# Patient Record
Sex: Male | Born: 1985 | Race: White | Hispanic: No | Marital: Single | Smoking: Never smoker
Health system: Southern US, Community
[De-identification: ages and names within clinical notes are randomized; demographics above are authoritative.]

## PROBLEM LIST (undated history)

## (undated) DIAGNOSIS — R519 Headache, unspecified: Secondary | ICD-10-CM

## (undated) DIAGNOSIS — R51 Headache: Secondary | ICD-10-CM

## (undated) HISTORY — PX: COSMETIC SURGERY: SHX468

---

## 2009-09-13 ENCOUNTER — Emergency Department (HOSPITAL_BASED_OUTPATIENT_CLINIC_OR_DEPARTMENT_OTHER): Admission: EM | Admit: 2009-09-13 | Discharge: 2009-09-13 | Payer: Self-pay | Admitting: Emergency Medicine

## 2010-12-23 ENCOUNTER — Emergency Department (HOSPITAL_COMMUNITY): Payer: Self-pay

## 2010-12-23 ENCOUNTER — Emergency Department (HOSPITAL_COMMUNITY)
Admission: EM | Admit: 2010-12-23 | Discharge: 2010-12-23 | Discharge status: Against Medical Advice | Payer: Self-pay | Attending: Emergency Medicine | Admitting: Emergency Medicine

## 2010-12-23 ENCOUNTER — Inpatient Hospital Stay (HOSPITAL_COMMUNITY): Admission: RE | Admit: 2010-12-23 | Payer: Self-pay | Source: Ambulatory Visit

## 2010-12-23 DIAGNOSIS — Z0389 Encounter for observation for other suspected diseases and conditions ruled out: Secondary | ICD-10-CM | POA: Insufficient documentation

## 2010-12-24 ENCOUNTER — Emergency Department (HOSPITAL_BASED_OUTPATIENT_CLINIC_OR_DEPARTMENT_OTHER)
Admission: EM | Admit: 2010-12-24 | Discharge: 2010-12-24 | Disposition: A | Discharge status: Home / Self Care | Payer: Self-pay | Attending: Emergency Medicine | Admitting: Emergency Medicine

## 2010-12-24 ENCOUNTER — Emergency Department (INDEPENDENT_AMBULATORY_CARE_PROVIDER_SITE_OTHER): Payer: Self-pay

## 2010-12-24 DIAGNOSIS — S52509A Unspecified fracture of the lower end of unspecified radius, initial encounter for closed fracture: Secondary | ICD-10-CM | POA: Insufficient documentation

## 2010-12-24 DIAGNOSIS — W19XXXA Unspecified fall, initial encounter: Secondary | ICD-10-CM | POA: Insufficient documentation

## 2011-10-26 ENCOUNTER — Emergency Department (INDEPENDENT_AMBULATORY_CARE_PROVIDER_SITE_OTHER): Payer: Self-pay

## 2011-10-26 ENCOUNTER — Encounter (HOSPITAL_BASED_OUTPATIENT_CLINIC_OR_DEPARTMENT_OTHER): Payer: Self-pay | Admitting: Emergency Medicine

## 2011-10-26 ENCOUNTER — Emergency Department (HOSPITAL_BASED_OUTPATIENT_CLINIC_OR_DEPARTMENT_OTHER)
Admission: EM | Admit: 2011-10-26 | Discharge: 2011-10-26 | Disposition: A | Discharge status: Home / Self Care | Payer: Self-pay | Attending: Emergency Medicine | Admitting: Emergency Medicine

## 2011-10-26 DIAGNOSIS — IMO0002 Reserved for concepts with insufficient information to code with codable children: Secondary | ICD-10-CM | POA: Insufficient documentation

## 2011-10-26 DIAGNOSIS — S62329A Displaced fracture of shaft of unspecified metacarpal bone, initial encounter for closed fracture: Secondary | ICD-10-CM

## 2011-10-26 DIAGNOSIS — S62319A Displaced fracture of base of unspecified metacarpal bone, initial encounter for closed fracture: Secondary | ICD-10-CM | POA: Insufficient documentation

## 2011-10-26 DIAGNOSIS — S62308A Unspecified fracture of other metacarpal bone, initial encounter for closed fracture: Secondary | ICD-10-CM

## 2011-10-26 DIAGNOSIS — X58XXXA Exposure to other specified factors, initial encounter: Secondary | ICD-10-CM

## 2011-10-26 MED ORDER — HYDROCODONE-ACETAMINOPHEN 5-500 MG PO TABS: 1.0000 | ORAL_TABLET | Freq: Four times a day (QID) | ORAL | Status: AC | PRN

## 2011-10-26 NOTE — ED Provider Notes (Signed)
Medical screening examination/treatment/procedure(s) were performed by non-physician practitioner and as supervising physician I was immediately available for consultation/collaboration.   Namya Voges M Nykolas Bacallao, MD 10/26/11 2320 
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
## 2011-12-16 ENCOUNTER — Emergency Department (HOSPITAL_BASED_OUTPATIENT_CLINIC_OR_DEPARTMENT_OTHER)
Admission: EM | Admit: 2011-12-16 | Discharge: 2011-12-16 | Disposition: A | Discharge status: Home / Self Care | Payer: Self-pay | Attending: Emergency Medicine | Admitting: Emergency Medicine

## 2011-12-16 ENCOUNTER — Encounter (HOSPITAL_BASED_OUTPATIENT_CLINIC_OR_DEPARTMENT_OTHER): Payer: Self-pay | Admitting: *Deleted

## 2011-12-16 DIAGNOSIS — Z880 Allergy status to penicillin: Secondary | ICD-10-CM | POA: Insufficient documentation

## 2011-12-16 DIAGNOSIS — B009 Herpesviral infection, unspecified: Secondary | ICD-10-CM | POA: Insufficient documentation

## 2011-12-16 MED ORDER — DOCOSANOL 10 % EX CREA: TOPICAL_CREAM | CUTANEOUS | Status: DC

## 2011-12-16 NOTE — Discharge Instructions (Signed)
Cold Sores (Herpes Simplex Virus) The herpes simplex virus causes fever blisters or cold sores. Fever blisters are small sores on the lips, gums or roof of the mouth. People commonly get infected with this herpes virus but do not have any symptoms. The blisters may break out when a person:  Is especially tired.   Has a fever.   Is menstruating (in women - having a monthly period).   Under emotional stress.   Has another infection (such as a cold).   Is exposed to sunlight.  Pain, tingling, itch, burning may occur before the blisters in the affected area. The blisters usually heal within a week. The virus can be easily passed to other people and to other parts of the body such as the eyes and sex organs. HOME CARE INSTRUCTIONS    Only take over-the-counter or prescription medicines for pain, discomfort, or fever as directed by your caregiver. Do not use aspirin.   Do not touch the blisters or pick the scabs. Wash your hands often and do not touch your eyes without washing your hands first.   To help reduce discomfort, apply an ice cube or ice pack to your lip for 30 minutes or suck on frozen juice bars.   Use a sun screen on your lips when you go outdoors.   This infection is contagious. Avoid close contact with other people, especially kissing or oral sex, until blisters heal. The virus that causes cold sores usually is different than the one that causes sores on the genitals. However, cold sores may occur in persons who have oral sex with a partner who has genital herpes.   Hot, cold, or salty foods may hurt your mouth. Use a straw to drink. Eating a well-balanced diet will help healing.  SEEK IMMEDIATE MEDICAL CARE IF:    Your eye feels irritated, painful, or you feel like you have something in your eye.   You develop a fever, feel achy, or see pus instead of clear fluid in the sores. These are signs of a bacterial (germ) infection. You can apply over-the-counter neomycin ointment.     You get blisters on your genitals.   You develop new unexplained symptoms.  MAKE SURE YOU:    Understand these instructions.   Will watch your condition.   Will get help right away if you are not doing well or get worse.  Document Released: 08/28/2000 Document Revised: 08/20/2011 Document Reviewed: 02/28/2009 [employer] Patient Information 2012 [employer]. 

## 2011-12-16 NOTE — ED Provider Notes (Signed)
History     CSN: [id number]  Arrival date & time 12/16/11  2310   First MD Initiated Contact with Patient 12/16/11 2323      Chief Complaint  Patient presents with  . Dental Problem     Patient is a 26 y.o. male presenting with rash. The history is provided by the patient.  Rash  This is a new problem. The problem has been gradually worsening. The problem is associated with nothing. Affected Location: lips. The pain is mild. The pain has been constant since onset. Associated symptoms include blisters and pain.  pt reports "cold sore" to lower lip for past several days.  He also reports he feels his "gums are swollen" No fever today No vomting No other complaints  PMH - none  History reviewed. No pertinent past surgical history.  History reviewed. No pertinent family history.  History  Substance Use Topics  . Smoking status: Never Smoker   . Smokeless tobacco: Not on file  . Alcohol Use: No      Review of Systems  Constitutional: Negative for fever.  Skin: Positive for rash.    Allergies  Penicillins  Home Medications   Current Outpatient Rx  Name Route Sig Dispense Refill  . DOCOSANOL 10 % EX CREA  Apply to cold sores 5x/day up to 10 days 1 Tube 0  . IBUPROFEN 200 MG PO TABS Oral Take 800 mg by mouth once.      BP 129/94  Pulse 58  Temp(Src) 98 F (36.7 C) (Oral)  Resp 18  Ht 5\' 9"  (1.753 m)  Wt 165 lb (74.844 kg)  BMI 24.37 kg/m2  SpO2 100%  Physical Exam CONSTITUTIONAL: Well developed/well nourished HEAD AND FACE: Normocephalic/atraumatic EYES: EOMI/PERRL ENMT: Mucous membranes moist.  Poor dentition.  No abscess.  No trismus.  No dental pain noted He has scattered vesicles to lower lip but no secondary signs of infection/drainage. Uvula midline.  No pharyngeal exudates NECK: supple no meningeal signs CV: S1/S2 noted, no murmurs/rubs/gallops noted LUNGS: Lungs are clear to auscultation bilaterally, no apparent distress ABDOMEN: soft,  nontender, no rebound or guarding NEURO: Pt is awake/alert, moves all extremitiesx4 EXTREMITIES: pulses normal, full ROM SKIN: warm, color normal PSYCH: no abnormalities of mood noted  ED Course  Procedures (including critical care time)    1. Herpes simplex       MDM  Nursing notes reviewed and considered in documentation  Pt reports he "took a pill" from girlfriend for his pain on his lips.  I advised him against this        Joya Gaskins, MD 12/16/11 [phone]

## 2011-12-16 NOTE — ED Notes (Signed)
Pt c/o cold sore to lip, gum swelling and pain to face for few days, denies fever

## 2011-12-16 NOTE — ED Notes (Signed)
MD at bedside. 

## 2012-06-11 IMAGING — CR DG HAND COMPLETE 3+V*R*
3 series · 3 of 3 positions shown · non-contrast
Comparison: 12/24/2010

CLINICAL DATA: Blunt trauma to the right hand last night.  Swelling
and pain.

RIGHT HAND - COMPLETE 3+ VIEW

[x hand pa right]
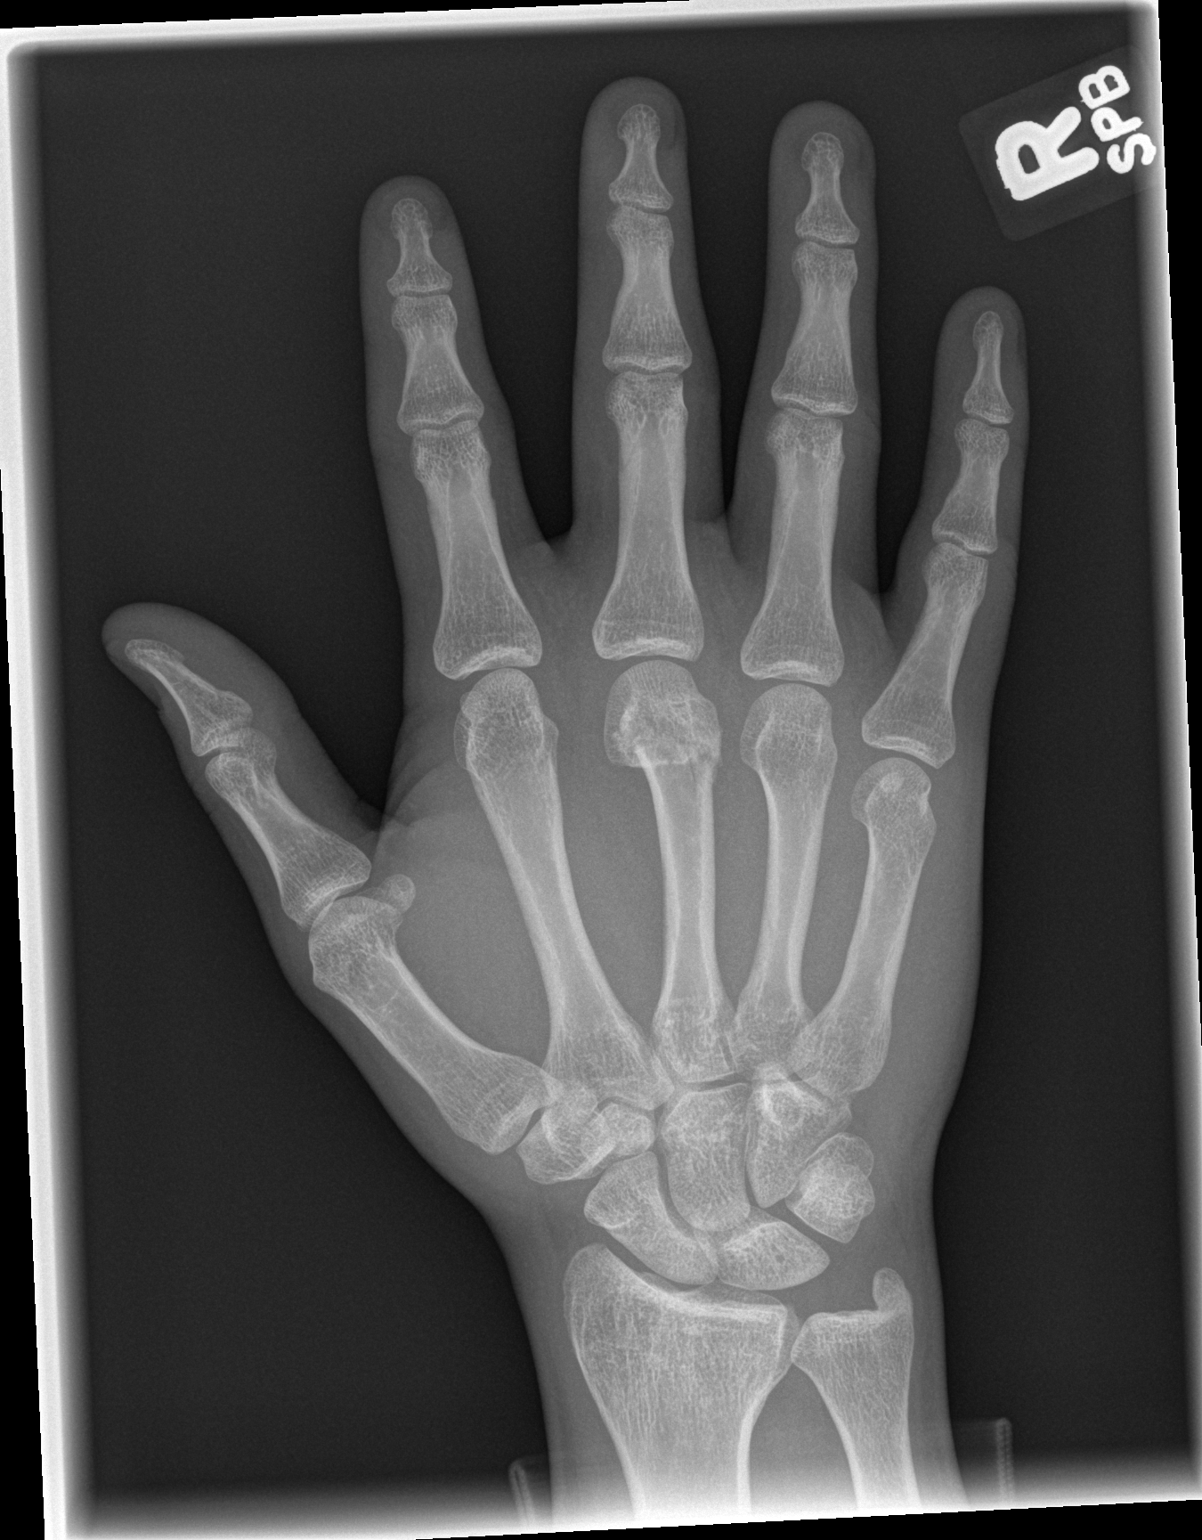

[x hand oblique right]
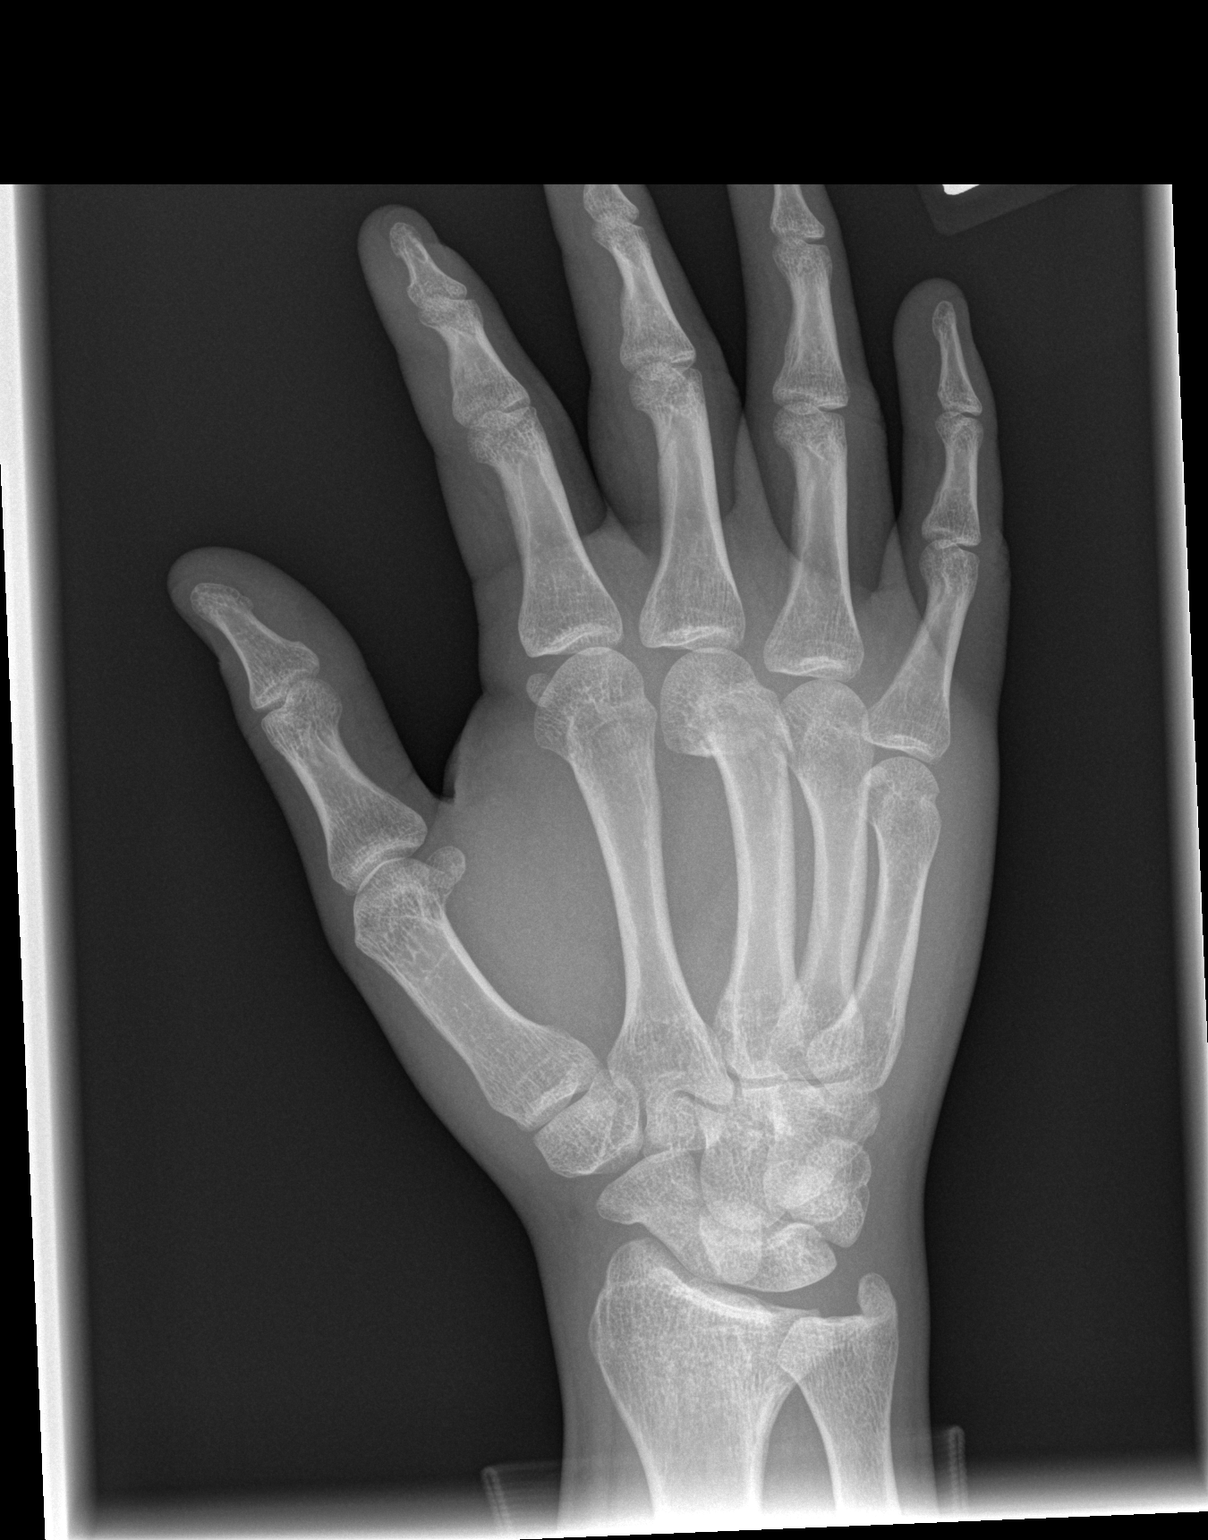

[x hand lat right]
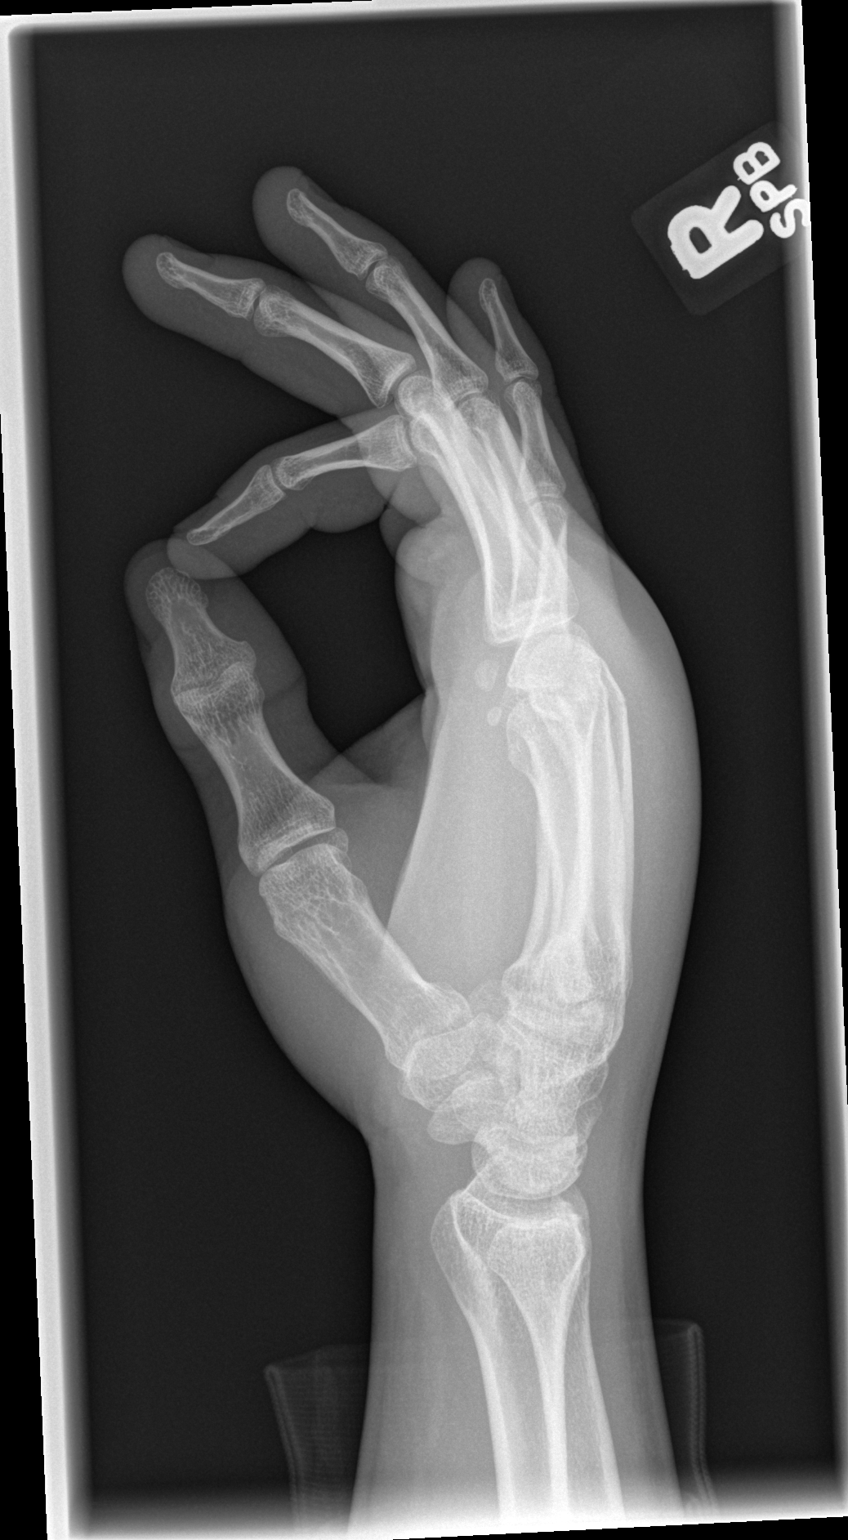

[3 of 3 positions shown; findings below may reference images not displayed]

FINDINGS: Comminuted appearing transverse fracture of the distal
right third metacarpal shaft with volar angulation of the distal
fracture fragments.  Dorsal soft tissue swelling.  No radiopaque
foreign bodies.  Deformity of the ulnar styloid process consistent
with healed fracture.  Right hand appears otherwise intact.
IMPRESSION: Acute fracture of the distal right third metacarpal shaft with
volar angulation of the distal fracture fragment.

## 2012-07-10 ENCOUNTER — Emergency Department (HOSPITAL_COMMUNITY)
Admission: EM | Admit: 2012-07-10 | Discharge: 2012-07-10 | Disposition: A | Discharge status: Home / Self Care | Payer: Worker's Compensation | Attending: Emergency Medicine | Admitting: Emergency Medicine

## 2012-07-10 ENCOUNTER — Encounter (HOSPITAL_COMMUNITY): Payer: Self-pay

## 2012-07-10 DIAGNOSIS — W35XXXA Explosion and rupture of boiler, initial encounter: Secondary | ICD-10-CM | POA: Insufficient documentation

## 2012-07-10 DIAGNOSIS — Y93E9 Activity, other interior property and clothing maintenance: Secondary | ICD-10-CM | POA: Insufficient documentation

## 2012-07-10 DIAGNOSIS — Y929 Unspecified place or not applicable: Secondary | ICD-10-CM | POA: Insufficient documentation

## 2012-07-10 DIAGNOSIS — S61209A Unspecified open wound of unspecified finger without damage to nail, initial encounter: Secondary | ICD-10-CM | POA: Insufficient documentation

## 2012-07-10 DIAGNOSIS — S61219A Laceration without foreign body of unspecified finger without damage to nail, initial encounter: Secondary | ICD-10-CM

## 2012-07-10 MED ORDER — TETANUS-DIPHTH-ACELL PERTUSSIS 5-2.5-18.5 LF-MCG/0.5 IM SUSP
0.5000 mL | Freq: Once | INTRAMUSCULAR | Status: AC
  Administered 2012-07-10: 0.5 mL via INTRAMUSCULAR
  Filled 2012-07-10: qty 0.5

## 2012-07-10 NOTE — ED Notes (Signed)
Patient reports that he got his finger cut by a fruit slicer at work around [employer]. Patient has a small laceration at the tip of his left middle finger. Bleeding controlled.

## 2012-07-10 NOTE — ED Provider Notes (Signed)
History     CSN: [id number]  Arrival date & time 07/10/12  [place]   First MD Initiated Contact with Patient 07/10/12 1908      Chief Complaint  Patient presents with  . Laceration    (Consider location/radiation/quality/duration/timing/severity/associated sxs/prior treatment) HPI Comments: This is a 26 year old male, who presents to the emergency department with a laceration on his left middle finger. The patient reports that he was doing dishes tonight when he cut his finger. Bleeding is controlled. Patient is in mild pain. No radiating symptoms.  The history is provided by the patient. No language interpreter was used.    History reviewed. No pertinent past medical history.  Past Surgical History  Procedure Date  . Cosmetic surgery     bit by a dog on the face    History reviewed. No pertinent family history.  History  Substance Use Topics  . Smoking status: Never Smoker   . Smokeless tobacco: Never Used  . Alcohol Use: No      Review of Systems  Skin: Positive for wound.       Left middle finger laceration  All other systems reviewed and are negative.    Allergies  Penicillins  Home Medications   Current Outpatient Rx  Name Route Sig Dispense Refill  . DOCOSANOL 10 % EX CREA  Apply to cold sores 5x/day up to 10 days 1 Tube 0  . IBUPROFEN 200 MG PO TABS Oral Take 800 mg by mouth once.      BP 128/78  Temp 97.8 F (36.6 C) (Oral)  Resp 16  SpO2 99%  Physical Exam  Nursing note and vitals reviewed. Constitutional: He is oriented to person, place, and time. He appears well-developed and well-nourished.  HENT:  Head: Normocephalic and atraumatic.  Eyes: Conjunctivae normal and EOM are normal. Pupils are equal, round, and reactive to light.  Neck: Normal range of motion. Neck supple.  Cardiovascular: Normal rate, regular rhythm and normal heart sounds.   Pulmonary/Chest: Effort normal. No respiratory distress.  Abdominal: Soft. Bowel sounds are  normal.  Musculoskeletal: Normal range of motion.  Neurological: He is alert and oriented to person, place, and time.  Skin: Skin is warm and dry.       1 cm very shallow laceration to the left middle finger, no foreign body,  Psychiatric: He has a normal mood and affect. His behavior is normal. Judgment and thought content normal.    ED Course  Procedures (including critical care time)  Labs Reviewed - No data to display No results found. Laceration:  1 cm laceration of middle left finger repaired using Dermabond, adequate irrigation and proper cleansing was used.  1. Finger laceration       MDM  26 year old male with left middle finger laceration. Laceration was irrigated and cleansed appropriately and then repaired using Dermabond. Return precautions have been given. The patient is stable and ready for discharge. Tetanus updated.        Roxy Horseman, PA-C 07/10/12 1937  Roxy Horseman, PA-C 07/10/12 1939

## 2012-07-10 NOTE — ED Provider Notes (Signed)
Medical screening examination/treatment/procedure(s) were performed by non-physician practitioner and as supervising physician I was immediately available for consultation/collaboration.  Toy Baker, MD 07/10/12 2252

## 2014-03-17 ENCOUNTER — Ambulatory Visit: Payer: Self-pay

## 2014-03-18 ENCOUNTER — Emergency Department (HOSPITAL_BASED_OUTPATIENT_CLINIC_OR_DEPARTMENT_OTHER)
Admission: EM | Admit: 2014-03-18 | Discharge: 2014-03-18 | Disposition: A | Discharge status: Home / Self Care | Payer: Self-pay | Attending: Emergency Medicine | Admitting: Emergency Medicine

## 2014-03-18 ENCOUNTER — Encounter (HOSPITAL_BASED_OUTPATIENT_CLINIC_OR_DEPARTMENT_OTHER): Payer: Self-pay | Admitting: Emergency Medicine

## 2014-03-18 DIAGNOSIS — Z791 Long term (current) use of non-steroidal anti-inflammatories (NSAID): Secondary | ICD-10-CM | POA: Insufficient documentation

## 2014-03-18 DIAGNOSIS — R110 Nausea: Secondary | ICD-10-CM | POA: Insufficient documentation

## 2014-03-18 DIAGNOSIS — Z880 Allergy status to penicillin: Secondary | ICD-10-CM | POA: Insufficient documentation

## 2014-03-18 DIAGNOSIS — G4489 Other headache syndrome: Secondary | ICD-10-CM | POA: Insufficient documentation

## 2014-03-18 MED ORDER — SUMATRIPTAN SUCCINATE 100 MG PO TABS: 100.0000 mg | ORAL_TABLET | ORAL | Status: DC | PRN
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
## 2014-03-18 NOTE — Discharge Instructions (Signed)

## 2014-03-18 NOTE — ED Notes (Signed)
Pt reports that he is getting more headaches recently. Has been taking ibuprofen with no relief. Pt sts he doesn't have a PMD. Sts that he has some nausea sometimes.  Reports having a mild headache at this time.

## 2014-03-19 NOTE — ED Provider Notes (Signed)
Medical screening examination/treatment/procedure(s) were performed by non-physician practitioner and as supervising physician I was immediately available for consultation/collaboration.  Shanna CiscoMegan E Erica Richwine, MD 03/19/14 2029

## 2014-04-29 ENCOUNTER — Emergency Department (HOSPITAL_BASED_OUTPATIENT_CLINIC_OR_DEPARTMENT_OTHER)
Admission: EM | Admit: 2014-04-29 | Discharge: 2014-04-30 | Disposition: A | Discharge status: Home / Self Care | Payer: Self-pay | Attending: Emergency Medicine | Admitting: Emergency Medicine

## 2014-04-29 ENCOUNTER — Encounter (HOSPITAL_BASED_OUTPATIENT_CLINIC_OR_DEPARTMENT_OTHER): Payer: Self-pay | Admitting: Emergency Medicine

## 2014-04-29 DIAGNOSIS — N509 Disorder of male genital organs, unspecified: Secondary | ICD-10-CM | POA: Insufficient documentation

## 2014-04-29 DIAGNOSIS — Z791 Long term (current) use of non-steroidal anti-inflammatories (NSAID): Secondary | ICD-10-CM | POA: Insufficient documentation

## 2014-04-29 DIAGNOSIS — R102 Pelvic and perineal pain: Secondary | ICD-10-CM

## 2014-04-29 DIAGNOSIS — R21 Rash and other nonspecific skin eruption: Secondary | ICD-10-CM | POA: Insufficient documentation

## 2014-04-29 DIAGNOSIS — Z79899 Other long term (current) drug therapy: Secondary | ICD-10-CM | POA: Insufficient documentation

## 2014-04-29 DIAGNOSIS — Z880 Allergy status to penicillin: Secondary | ICD-10-CM | POA: Insufficient documentation

## 2014-04-29 NOTE — ED Notes (Signed)
C/o rash to to buttocks. States rash is on and off for past couple months. States rash is irritated  by the type of work that he does. Denies any other location of rash. States that he has never looked at the rash. Denies any fever.

## 2014-04-30 ENCOUNTER — Encounter (HOSPITAL_BASED_OUTPATIENT_CLINIC_OR_DEPARTMENT_OTHER): Payer: Self-pay | Admitting: Emergency Medicine
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
## 2014-04-30 NOTE — ED Notes (Signed)
Pt ambulating independently w/ steady gait on d/c in no acute distress, A&Ox4.  

## 2014-07-15 ENCOUNTER — Encounter (HOSPITAL_BASED_OUTPATIENT_CLINIC_OR_DEPARTMENT_OTHER): Payer: Self-pay

## 2014-07-15 ENCOUNTER — Emergency Department (HOSPITAL_BASED_OUTPATIENT_CLINIC_OR_DEPARTMENT_OTHER)
Admission: EM | Admit: 2014-07-15 | Discharge: 2014-07-15 | Disposition: A | Discharge status: Home / Self Care | Payer: Self-pay | Attending: Emergency Medicine | Admitting: Emergency Medicine

## 2014-07-15 DIAGNOSIS — Z880 Allergy status to penicillin: Secondary | ICD-10-CM | POA: Insufficient documentation

## 2014-07-15 DIAGNOSIS — H00011 Hordeolum externum right upper eyelid: Secondary | ICD-10-CM | POA: Insufficient documentation

## 2014-07-15 DIAGNOSIS — H00013 Hordeolum externum right eye, unspecified eyelid: Secondary | ICD-10-CM

## 2014-07-15 MED ORDER — TETRACAINE HCL 0.5 % OP SOLN
1.0000 [drp] | Freq: Once | OPHTHALMIC | Status: DC
  Filled 2014-07-15: qty 2

## 2014-07-15 MED ORDER — ERYTHROMYCIN 5 MG/GM OP OINT
TOPICAL_OINTMENT | Freq: Four times a day (QID) | OPHTHALMIC | Status: DC
  Administered 2014-07-15: 23:00:00 via OPHTHALMIC
  Filled 2014-07-15: qty 3.5

## 2014-07-15 MED ORDER — FLUORESCEIN SODIUM 1 MG OP STRP
1.0000 | ORAL_STRIP | Freq: Once | OPHTHALMIC | Status: AC
  Administered 2014-07-15: 1 via OPHTHALMIC
  Filled 2014-07-15: qty 1

## 2014-07-15 NOTE — ED Notes (Signed)
Pt reports his eye was itching and when he scratched it his eye became red and swollen.  Denies vision changes.

## 2014-07-15 NOTE — Discharge Instructions (Signed)
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
## 2014-09-09 ENCOUNTER — Emergency Department (HOSPITAL_BASED_OUTPATIENT_CLINIC_OR_DEPARTMENT_OTHER)
Admission: EM | Admit: 2014-09-09 | Discharge: 2014-09-09 | Disposition: A | Discharge status: Home / Self Care | Payer: Self-pay | Attending: Emergency Medicine | Admitting: Emergency Medicine

## 2014-09-09 ENCOUNTER — Encounter (HOSPITAL_BASED_OUTPATIENT_CLINIC_OR_DEPARTMENT_OTHER): Payer: Self-pay | Admitting: Emergency Medicine

## 2014-09-09 DIAGNOSIS — H04302 Unspecified dacryocystitis of left lacrimal passage: Secondary | ICD-10-CM | POA: Insufficient documentation

## 2014-09-09 DIAGNOSIS — Z880 Allergy status to penicillin: Secondary | ICD-10-CM | POA: Insufficient documentation

## 2014-09-09 HISTORY — DX: Headache, unspecified: R51.9

## 2014-09-09 HISTORY — DX: Headache: R51

## 2014-09-09 MED ORDER — CLINDAMYCIN HCL 300 MG PO CAPS: 300.0000 mg | ORAL_CAPSULE | Freq: Four times a day (QID) | ORAL | Status: DC

## 2014-09-09 NOTE — ED Provider Notes (Signed)
CSN: [id number]     Arrival date & time 09/09/14  2107 History  This chart was scribed for Todd MoMatthew Gentry, MD by Tonye RoyaltyJoshua Chen, ED Scribe. This patient was seen in room MH08/MH08 and the patient's care was started at 9:32 PM.    Chief Complaint  Patient presents with  . Eye Pain   Patient is a 28 y.o. male presenting with eye pain. The history is provided by the patient. No language interpreter was used.  Eye Pain This is a recurrent problem. The current episode started yesterday. The problem occurs constantly. The problem has been gradually improving. Pertinent negatives include no abdominal pain and no shortness of breath. Nothing aggravates the symptoms. The symptoms are relieved by heat (and Benadryl). He has tried a warm compress (and Benadryl) for the symptoms. The treatment provided moderate relief.    HPI Comments: Timmothy Soursicholas Gregson is a 28 y.o. male who presents to the Emergency Department complaining of swelling and itching to his right eye with onset yesterday, worse on waking this morning. He noted from purulent discharge from it today. He states he had similar symptoms 1 month ago. He states it is not painful and does not feel like there is a foreign body in it. He states it improved after Benadryl and putting a hot towel on it. He denies nausea, vomiting, fever  Past Medical History  Diagnosis Date  . Headache    Past Surgical History  Procedure Laterality Date  . Cosmetic surgery      bit by a dog on the face   History reviewed. No pertinent family history. History  Substance Use Topics  . Smoking status: Never Smoker   . Smokeless tobacco: Never Used  . Alcohol Use: No    Review of Systems  Constitutional: Negative for fever.  Eyes: Positive for discharge and itching (and swelling). Negative for pain.  Respiratory: Negative for shortness of breath.   Gastrointestinal: Negative for nausea, vomiting and abdominal pain.  All other systems reviewed and are  negative.     Allergies  Penicillins  Home Medications   Prior to Admission medications   Medication Sig Start Date End Date Taking? Authorizing Provider  clindamycin (CLEOCIN) 300 MG capsule Take 1 capsule (300 mg total) by mouth 4 (four) times daily. X 7 days 09/09/14   Todd MoMatthew Gentry, MD  Docosanol (ABREVA) 10 % CREA Apply to cold sores 5x/day up to 10 days 12/16/11   Joya Gaskinsonald W Wickline, MD  ibuprofen (ADVIL,MOTRIN) 200 MG tablet Take 800 mg by mouth once.    Historical Provider, MD  SUMAtriptan (IMITREX) 100 MG tablet Take 1 tablet (100 mg total) by mouth every 2 (two) hours as needed for migraine or headache. May repeat in 2 hours if headache persists or recurs. 03/18/14   Elson AreasLeslie K Sofia, PA-C   BP 117/72 mmHg  Pulse 56  Temp(Src) 98 F (36.7 C) (Oral)  Resp 16  Ht 5\' 8"  (1.727 m)  Wt 175 lb (79.379 kg)  BMI 26.61 kg/m2  SpO2 97% Physical Exam  Constitutional: He is oriented to person, place, and time. He appears well-developed and well-nourished.  HENT:  Head: Normocephalic and atraumatic.  Eyes: EOM are normal. Left conjunctiva is injected. Left conjunctiva has no hemorrhage.  Erythema overlying superior medial canthus, no signs of conjunctivitis or painful or LROM of EOM  Neck: Normal range of motion. Neck supple.  Cardiovascular: Normal rate, regular rhythm and normal heart sounds.   Pulmonary/Chest: Effort normal and breath sounds normal.  No respiratory distress.  Abdominal: He exhibits no distension. There is no tenderness. There is no rebound and no guarding.  Musculoskeletal: Normal range of motion.  Neurological: He is alert and oriented to person, place, and time.  Skin: Skin is warm and dry.  Vitals reviewed.   ED Course  Procedures (including critical care time)  DIAGNOSTIC STUDIES: Oxygen Saturation is 97% on room air, normal by my interpretation.    COORDINATION OF CARE: 9:37 PM Discussed treatment plan with patient at beside, including Benadryl and warm  compress with follow up to ophthalmologist since this is the second time. The patient agrees with the plan and has no further questions at this time.   Labs Review Labs Reviewed - No data to display  Imaging Review No results found.   EKG Interpretation None      MDM   Final diagnoses:  Dacryocystitis, left    28 y.o. male without pertinent PMH presents with recurrent symptoms over the last few months involving redness, itching, and swelling to R eye nasal margin.  No systemic symptoms or visual complaint.  Exam as above consistent with dacrocystitis.  No conjunctival irritation or foreign body sensation. Doubt orbital or preseptal cellulitis at this time, but erythema is concerning for possible emerging preseptal cellulitis, so will treat with systemic abx and have pt fu urgently with ophthalmology.  He was given strict return precautions for orbital cellulitis, zoster ophthalmicus, or other emergency ocular conditions.  DC home in stable condition. I have reviewed all laboratory and imaging studies if ordered as above  1. Dacryocystitis, left           Todd MoMatthew Gentry, MD 09/10/14 1353

## 2014-09-09 NOTE — Discharge Instructions (Signed)
Dacryocystitis  Dacryocystitis is an infection of the tear sac (lacrimal sac). The lacrimal sac lies between the inner corner of the eyelids and the nose. The glands of the eyelids produce tears. This is to keep the surface of the eye wet and protect it. These tears drain from the surface of the eyes through a duct in each lid (lacrimal ducts), then through the lacrimal sac into the nose. The tears are then swallowed. If the lacrimal sacs become blocked, bacteria begin to buildup. The lacrimal sacs can become infected. Dacryocystitis may be sudden (acute) or long-lasting (chronic). This problem is most common in infants because the tear ducts are not fully developed and clog easily. In that case, infants may have episodes of tearing and infection. However, in most cases, the problem gets better as the infant grows. CAUSES  The cause is often unknown. Known causes can include:  Malformation of the lacrimal sac.  Injury to the eye.  Eye infection.  Injury or inflammation of the nasal passages. SYMPTOMS   Usually only one eye is involved.  Excessive tearing and watering from the involved eye.  Tenderness, redness, and swelling of the lower lid near the nose.  A sore, red, inflamed bump on the inner corner of the lower lid. DIAGNOSIS  A diagnosis is made after an eye exam to see how much blockage is present and if the surface of the eye is also infected. A culture of the fluid from the lacrimal sac may be examined to find if a specific infection is present. TREATMENT  Treatment depends on:   The person's age.  Whether or not the infection is chronic or acute.  The amount of blockage that is present. Additional treatment Sometimes massaging the area (starting from the inside of the eye and gently massaging down toward the nose) will improve the condition, combined with antibiotic eyedrops or ointments. If massaging the area does not work, it may be necessary to probe the ducts and open up  the drainage system. While this is easily done in the office in adults, probing usually has to be done under general anesthesia in infants.  If the blockage cannot be cleared by probing, surgery may be needed under general anesthesia to create a direct opening for tears to flow between the lacrimal sac and the inside of the nose (dacryocystorhinostomy, DCR). HOME CARE INSTRUCTIONS   Use any antibiotic eyedrops, ointment, or pills as directed by the caregiver. Finish all medicines even if the symptoms start to get better.  Massage the lacrimal sac as directed by the caregiver. SEEK IMMEDIATE MEDICAL CARE IF:   There is increased pain, swelling, redness, or drainage from the eye.  Muscle aches, chills, or a general sick feeling develop.  A fever or persistent symptoms develop for more than 2-3 days.  The fever and symptoms suddenly get worse. MAKE SURE YOU:   Understand these instructions.  Will watch your condition.  Will get help right away if you are not doing well or get worse. Document Released: 08/28/2000 Document Revised: 01/15/2014 Document Reviewed: 02/01/2012 [employer] Patient Information 2015 [employer]. This information is not intended to replace advice given to you by your health care provider. Make sure you discuss any questions you have with your health care provider.  

## 2014-09-09 NOTE — ED Notes (Signed)
Pt reports right eye edema and itching with drainage x1 day denies vision change. [facility]. Benadryl  And warm compress ineffective

## 2014-09-09 NOTE — ED Notes (Signed)
EDP Dr. Littie DeedsGentry in to room

## 2014-09-09 NOTE — ED Notes (Signed)
Pt seen by EDP prior to RN assessment, see MD notes, orders received to d/c. Pt alert, NAD, calm,interactive, resps e/u, speaking in clear complete sentences, reports R eye was swollen shut this am, reports drainage, not swollen shut at this time, has been using benadryl and warm compresses w/o minimal relief, swelling & redness still present, (denies: visual changes, HA, dizziness, nv, recent injury or fever).

## 2014-09-14 HISTORY — PX: OTHER SURGICAL HISTORY: SHX169

## 2015-06-10 ENCOUNTER — Emergency Department (HOSPITAL_BASED_OUTPATIENT_CLINIC_OR_DEPARTMENT_OTHER)
Admission: EM | Admit: 2015-06-10 | Discharge: 2015-06-10 | Disposition: A | Discharge status: Home / Self Care | Payer: Self-pay | Attending: Emergency Medicine | Admitting: Emergency Medicine

## 2015-06-10 ENCOUNTER — Encounter (HOSPITAL_BASED_OUTPATIENT_CLINIC_OR_DEPARTMENT_OTHER): Payer: Self-pay | Admitting: *Deleted

## 2015-06-10 DIAGNOSIS — R21 Rash and other nonspecific skin eruption: Secondary | ICD-10-CM | POA: Insufficient documentation

## 2015-06-10 DIAGNOSIS — Z880 Allergy status to penicillin: Secondary | ICD-10-CM | POA: Insufficient documentation

## 2015-06-10 DIAGNOSIS — H578 Other specified disorders of eye and adnexa: Secondary | ICD-10-CM | POA: Insufficient documentation

## 2015-06-10 DIAGNOSIS — H0289 Other specified disorders of eyelid: Secondary | ICD-10-CM

## 2015-06-10 MED ORDER — POLYMYXIN B-TRIMETHOPRIM 10000-0.1 UNIT/ML-% OP SOLN: 2.0000 [drp] | Freq: Four times a day (QID) | OPHTHALMIC | Status: DC

## 2015-06-10 MED ORDER — FLUORESCEIN SODIUM 1 MG OP STRP
1.0000 | ORAL_STRIP | Freq: Once | OPHTHALMIC | Status: AC
  Administered 2015-06-10: 1 via OPHTHALMIC
  Filled 2015-06-10: qty 1

## 2015-06-10 MED ORDER — TETRACAINE HCL 0.5 % OP SOLN
1.0000 [drp] | Freq: Once | OPHTHALMIC | Status: AC
  Administered 2015-06-10: 1 [drp] via OPHTHALMIC
  Filled 2015-06-10: qty 2

## 2015-06-10 MED ORDER — ACYCLOVIR 400 MG PO TABS: 400.0000 mg | ORAL_TABLET | Freq: Three times a day (TID) | ORAL | Status: DC

## 2015-06-10 MED ORDER — NAPROXEN 250 MG PO TABS: 250.0000 mg | ORAL_TABLET | Freq: Two times a day (BID) | ORAL | Status: DC

## 2015-06-10 NOTE — Discharge Instructions (Signed)
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 Please follow up with  Ophthalmology and use antibiotic eyedrops and antiviral medication.  Conjunctivitis Conjunctivitis is commonly called "pink eye." Conjunctivitis can be caused by bacterial or viral infection, allergies, or injuries. There is usually redness of the lining of the eye, itching, discomfort, and sometimes discharge. There may be deposits of matter along the eyelids. A viral infection usually causes a watery discharge, while a bacterial infection causes a yellowish, thick discharge. Pink eye is very contagious and spreads by direct contact. You may be given antibiotic eyedrops as part of your treatment. Before using your eye medicine, remove all drainage from the eye by washing gently with warm water and cotton balls. Continue to use the medication until you have awakened 2 mornings in a row without discharge from the eye. Do not rub your eye. This increases the irritation and helps spread infection. Use separate towels from other household members. Wash your hands with soap and water before and after touching your eyes. Use cold compresses to reduce pain and sunglasses to relieve irritation from light. Do not wear contact lenses or wear eye makeup until the infection is gone. SEEK MEDICAL CARE IF:   Your symptoms are not better after 3 days of treatment.  You have increased pain or trouble seeing.  The outer eyelids become very red or swollen. Document Released: 10/08/2004 Document Revised: 11/23/2011 Document Reviewed: 08/31/2005 [facility] Patient Information 2015 [place], [place]. This information is not intended to replace advice given to you by your health care provider. Make sure you discuss any questions you have with your health care provider. Herpes Simplex Herpes simplex is generally classified as Type 1 or Type 2. Type 1 is generally the type that is responsible for cold sores. Type 2 is generally associated with sexually  transmitted diseases. We now know that most of the thoughts on these viruses are inaccurate. We find that HSV1 is also present genitally and HSV2 can be present orally, but this will vary in different locations of the world. Herpes simplex is usually detected by doing a culture. Blood tests are also available for this virus; however, the accuracy is often not as good.  PREPARATION FOR TEST No preparation or fasting is necessary. NORMAL FINDINGS  No virus present  No HSV antigens or antibodies present Ranges for normal findings may vary among different laboratories and hospitals. You should always check with your doctor after having lab work or other tests done to discuss the meaning of your test results and whether your values are considered within normal limits. MEANING OF TEST  Your caregiver will go over the test results with you and discuss the importance and meaning of your results, as well as treatment options and the need for additional tests if necessary. OBTAINING THE TEST RESULTS  It is your responsibility to obtain your test results. Ask the lab or department performing the test when and how you will get your results. Document Released: 10/03/2004 Document Revised: 11/23/2011 Document Reviewed: 08/11/2008 [facility] Patient Information 2015 [place], [place]. This information is not intended to replace advice given to you by your health care provider. Make sure you discuss any questions you have with your health care provider.

## 2015-06-10 NOTE — ED Provider Notes (Signed)
CSN: [id number]     Arrival date & time 06/10/15  1221 History   First MD Initiated Contact with Patient 06/10/15 1405     Chief Complaint  Patient presents with  . Eye Problem   Todd Harding is a 29 y.o. male who is otherwise healthy who presents to the ED complaining of pain and swelling to his right upper eyelid since yesterday.  The patient reports is the fourth time his right eyelid has he comes swollen and he reports it seems only happen when he travels to his girlfriend's father's house. He complains of some white discharge and matting when he woke up this morning. He denies any eye pain just pain to his right upper eyelid. 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 He reports this feels the same as last time.   He reports 8 out of 10 pain and has taken ibuprofen with some relief.The patient denies fevers, double vision, eye pain, pain with movement of his eye,  Nasal congestion, postnasal drip, sneezing, or other rashes. He denies genital lesions.  (Consider location/radiation/quality/duration/timing/severity/associated sxs/prior Treatment) HPI  Past Medical History  Diagnosis Date  . Headache    Past Surgical History  Procedure Laterality Date  . Cosmetic surgery      bit by a dog on the face   No family history on file. Social History  Substance Use Topics  . Smoking status: Never Smoker   . Smokeless tobacco: Never Used  . Alcohol Use: No    Review of Systems  Constitutional: Negative for fever and chills.  HENT: Negative for congestion, ear discharge, ear pain, postnasal drip, rhinorrhea and trouble swallowing.   Eyes: Positive for discharge and redness. Negative for photophobia and visual disturbance.  Respiratory: Negative for cough and shortness of breath.   Genitourinary: Negative for genital sores.  Musculoskeletal: Negative for myalgias and arthralgias.  Skin: Positive for rash. Negative for  wound.  Neurological: Negative for dizziness, light-headedness and headaches.      Allergies  Penicillins  Home Medications   Prior to Admission medications   Medication Sig Start Date End Date Taking? Authorizing Mellany Dinsmore  acyclovir (ZOVIRAX) 400 MG tablet Take 1 tablet (400 mg total) by mouth 3 (three) times daily. 06/10/15   Everlene Farrier, PA-C  clindamycin (CLEOCIN) 300 MG capsule Take 1 capsule (300 mg total) by mouth 4 (four) times daily. X 7 days 09/09/14   Mirian Mo, MD  Docosanol (ABREVA) 10 % CREA Apply to cold sores 5x/day up to 10 days 12/16/11   Zadie Rhine, MD  ibuprofen (ADVIL,MOTRIN) 200 MG tablet Take 800 mg by mouth once.    Historical Letha Mirabal, MD  naproxen (NAPROSYN) 250 MG tablet Take 1 tablet (250 mg total) by mouth 2 (two) times daily with a meal. 06/10/15   Everlene Farrier, PA-C  SUMAtriptan (IMITREX) 100 MG tablet Take 1 tablet (100 mg total) by mouth every 2 (two) hours as needed for migraine or headache. May repeat in 2 hours if headache persists or recurs. 03/18/14   Elson Areas, PA-C  trimethoprim-polymyxin b (POLYTRIM) ophthalmic solution Place 2 drops into the right eye every 6 (six) hours. 06/10/15   Everlene Farrier, PA-C   BP 116/77 mmHg  Pulse 74  Temp(Src) 98.3 F (36.8 C) (Oral)  Resp 20  Ht  (1.727 m)  Wt 175 lb (79.379 kg)  BMI 26.61 kg/m2  SpO2 100% Physical Exam  Constitutional: He is oriented to person, place, and time. He appears well-developed and well-nourished. No distress.   Nontoxic appearing.  HENT:  Head: Normocephalic and atraumatic.  Eyes: Conjunctivae and EOM are normal. Pupils are equal, round, and reactive to light. Right eye exhibits no discharge. Left eye exhibits no discharge.   Patient has right upper eyelid erythema and edema with 2 small vesicles to the medial aspect. They appear to be herpes.  No conjunctival injection. EOMs are intact. No pain with EOMs.  Vision is grossly intact.  His right eye was  anesthetized with tetracaine and stained with fluroescein. No fluorescein uptake on exam. No evidence of dendritic lesions or corneal abrasions.  Neck: Normal range of motion. Neck supple. No JVD present. No tracheal deviation present.  Cardiovascular: Normal rate, regular rhythm, normal heart sounds and intact distal pulses.   Pulmonary/Chest: Effort normal and breath sounds normal. No respiratory distress. He has no wheezes. He has no rales.  Abdominal: Soft. There is no tenderness.  Lymphadenopathy:    He has no cervical adenopathy.  Neurological: He is alert and oriented to person, place, and time. No cranial nerve deficit. Coordination normal.  Skin: Skin is warm and dry. No rash noted. He is not diaphoretic. No erythema. No pallor.  Psychiatric: He has a normal mood and affect. His behavior is normal.  Nursing note and vitals reviewed.   ED Course  Procedures (including critical care time) Labs Review Labs Reviewed - No data to display  Imaging Review No results found.    EKG Interpretation None      Filed Vitals:   06/10/15 1232  BP: 116/77  Pulse: 74  Temp: 98.3 F (36.8 C)  TempSrc: Oral  Resp: 20  Height:  (1.727 m)  Weight: 175 lb (79.379 kg)  SpO2: 100%     MDM   Meds given in ED:  Medications  tetracaine (PONTOCAINE) 0.5 % ophthalmic solution 1 drop (1 drop Both Eyes Given 06/10/15 1423)  fluorescein ophthalmic strip 1 strip (1 strip Both Eyes Given 06/10/15 1423)    New Prescriptions   ACYCLOVIR (ZOVIRAX) 400 MG TABLET    Take 1 tablet (400 mg total) by mouth 3 (three) times daily.   NAPROXEN (NAPROSYN) 250 MG TABLET    Take 1 tablet (250 mg total) by mouth 2 (two) times daily with a meal.   TRIMETHOPRIM-POLYMYXIN B (POLYTRIM) OPHTHALMIC SOLUTION    Place 2 drops into the right eye every 6 (six) hours.    Final diagnoses:  Irritation of eyelid   This is a 29 y.o. male who is otherwise healthy who presents to the ED complaining of pain and  swelling to his right upper eyelid since yesterday.  The patient reports is the fourth time his right eyelid has he comes swollen and he reports it seems only happen when he travels to his girlfriend's father's house. He complains of some white discharge and matting when he woke up this morning. He denies any eye pain just pain to his right upper eyelid. 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   on exam the patient is afebrile nontoxic appearing. The patient has erythema and edema of his right upper eyelid with 2 small vesicles which are tender to touch. They appear to be herpes lesions. He has no pain with extraocular movements. I'm not concerned for post septal cellulitis at this time. Patient's right eye was  anesthetized with tetracaine and stained with fluorescein. No forcing uptake and no evidence of dendritic lesions on exam. Will discharge with prescriptions for acyclovir, naproxen and Polytrim eyedrops. I advised the patient needs to follow-up with ophthalmology this week. I advised the patient to follow-up with their primary care Valicia Rief this week. I advised the patient to return to the emergency department with new or worsening symptoms or new concerns. The patient verbalized understanding and agreement with plan.    This patient was discussed with Dr. Clydene Pugh who agrees with assessment and plan.   Everlene Farrier, PA-C 06/10/15 1459  Lyndal Pulley, MD 06/10/15 2052

## 2015-06-10 NOTE — ED Notes (Signed)
Right eye swelling since last night. States this is the 4th time his eye swells when he goes to a particular place. He has seen each time with different diagnosis each time.

## 2017-06-09 ENCOUNTER — Encounter: Payer: Self-pay | Admitting: Emergency Medicine

## 2017-06-09 ENCOUNTER — Ambulatory Visit (INDEPENDENT_AMBULATORY_CARE_PROVIDER_SITE_OTHER): Payer: Managed Care, Other (non HMO) | Admitting: Emergency Medicine

## 2017-06-09 VITALS — BP 124/82 | HR 94 | Temp 97.6°F | Resp 17 | Ht 68.0 in | Wt 167.0 lb

## 2017-06-09 DIAGNOSIS — K644 Residual hemorrhoidal skin tags: Secondary | ICD-10-CM

## 2017-06-09 MED ORDER — HYDROCORTISONE 2.5 % RE CREA: 1.0000 "application " | TOPICAL_CREAM | Freq: Two times a day (BID) | RECTAL | 0 refills | Status: DC

## 2017-06-09 NOTE — Patient Instructions (Addendum)
     IF you received an x-ray today, you will receive an invoice from [facility]. Please contact [facility] at [phone] with questions or concerns regarding your invoice.   IF you received labwork today, you will receive an invoice from [facility]. Please contact [facility] at [phone] with questions or concerns regarding your invoice.   Our billing staff will not be able to assist you with questions regarding bills from these companies.  You will be contacted with the lab results as soon as they are available. The fastest way to get your results is to activate your My Chart account. Instructions are located on the last page of this paperwork. If you have not heard from us regarding the results in 2 weeks, please contact this office.     Hemorrhoids Hemorrhoids are swollen veins in and around the rectum or anus. Hemorrhoids can cause pain, itching, or bleeding. Most of the time, they do not cause serious problems. They usually get better with diet changes, lifestyle changes, and other home treatments. Follow these instructions at home: Eating and drinking  Eat foods that have fiber, such as whole grains, beans, nuts, fruits, and vegetables. Ask your doctor about taking products that have added fiber (fibersupplements).  Drink enough fluid to keep your pee (urine) clear or pale yellow. For Pain and Swelling  Take a warm-water bath (sitz bath) for 20 minutes to ease pain. Do this 3-4 times a day.  If directed, put ice on the painful area. It may be helpful to use ice between your warm baths. ? Put ice in a plastic bag. ? Place a towel between your skin and the bag. ? Leave the ice on for 20 minutes, 2-3 times a day. General instructions  Take over-the-counter and prescription medicines only as told by your doctor. ? Medicated creams and medicines that are inserted into the anus (suppositories) may be used or applied as told.  Exercise often.  Go to the  bathroom when you have the urge to poop (to have a bowel movement). Do not wait.  Avoid pushing too hard (straining) when you poop.  Keep the butt area dry and clean. Use wet toilet paper or moist paper towels.  Do not sit on the toilet for a long time. Contact a doctor if:  You have any of these: ? Pain and swelling that do not get better with treatment or medicine. ? Bleeding that will not stop. ? Trouble pooping or you cannot poop. ? Pain or swelling outside the area of the hemorrhoids. This information is not intended to replace advice given to you by your health care provider. Make sure you discuss any questions you have with your health care provider. Document Released: 06/09/2008 Document Revised: 02/06/2016 Document Reviewed: 05/15/2015 [employer] Interactive Patient Education  2018 [employer]  

## 2017-06-09 NOTE — Progress Notes (Signed)
Todd Harding 31 y.o.   Chief Complaint  Patient presents with  . Hemorrhoids    HISTORY OF PRESENT ILLNESS: This is a 31 y.o. male complaining of painful hemorrhoid; denies bleeding or any other associated symptoms.  HPI   Prior to Admission medications   Medication Sig Start Date End Date Taking? Authorizing Provider  ibuprofen (ADVIL,MOTRIN) 200 MG tablet Take 800 mg by mouth once.   Yes [provider]  clindamycin (CLEOCIN) 300 MG capsule Take 1 capsule (300 mg total) by mouth 4 (four) times daily. X 7 days Patient not taking: Reported on 06/09/2017 09/09/14   Mirian Mo, MD  naproxen (NAPROSYN) 250 MG tablet Take 1 tablet (250 mg total) by mouth 2 (two) times daily with a meal. Patient not taking: Reported on 06/09/2017 06/10/15   Everlene Farrier, PA-C  SUMAtriptan (IMITREX) 100 MG tablet Take 1 tablet (100 mg total) by mouth every 2 (two) hours as needed for migraine or headache. May repeat in 2 hours if headache persists or recurs. Patient not taking: Reported on 06/09/2017 03/18/14   Elson Areas, PA-C    Allergies  Allergen Reactions  . Penicillins     unknown    There are no active problems to display for this patient.   Past Medical History:  Diagnosis Date  . Headache     Past Surgical History:  Procedure Laterality Date  . COSMETIC SURGERY     bit by a dog on the face    Social History   Social History  . Marital status: Single    Spouse name: N/A  . Number of children: N/A  . Years of education: N/A   Occupational History  . Not on file.   Social History Main Topics  . Smoking status: Never Smoker  . Smokeless tobacco: Never Used  . Alcohol use No  . Drug use: No  . Sexual activity: No   Other Topics Concern  . Not on file   Social History Narrative  . No narrative on file    No family history on file.   Review of Systems  Constitutional: Negative.  Negative for chills and fever.  Respiratory: Negative for  shortness of breath.   Gastrointestinal: Positive for constipation. Negative for abdominal pain, blood in stool, diarrhea, melena, nausea and vomiting.  Genitourinary: Negative for dysuria and hematuria.  Neurological: Negative.  Negative for dizziness and headaches.  Endo/Heme/Allergies: Negative.     Vitals:   06/09/17 1413  BP: 124/82  Pulse: 94  Resp: 17  Temp: 97.6 F (36.4 C)  SpO2: 98%    Physical Exam  Constitutional: He is oriented to person, place, and time. He appears well-developed and well-nourished.  HENT:  Head: Normocephalic.  Eyes: Pupils are equal, round, and reactive to light. EOM are normal.  Neck: Normal range of motion.  Cardiovascular: Normal rate.   Pulmonary/Chest: Effort normal.  Abdominal: Soft. Bowel sounds are normal. He exhibits no distension. There is no tenderness.  Genitourinary: Rectal exam shows external hemorrhoid (tender and inflammed; non-thrombosed and not bleeding).  Musculoskeletal: Normal range of motion.  Neurological: He is alert and oriented to person, place, and time.  Skin: Skin is warm and dry. Capillary refill takes less than 2 seconds.  Vitals reviewed.    ASSESSMENT & PLAN:  Kelsen was seen today for hemorrhoids.  Diagnoses and all orders for this visit:  Hemorrhoids, external, with complication Comments: painful  Other orders -     hydrocortisone (ANUSOL-HC) 2.5 %  rectal cream; Place 1 application rectally 2 (two) times daily.    Patient Instructions       IF you received an x-ray today, you will receive an invoice from [facility]. Please contact [facility] at [phone] with questions or concerns regarding your invoice.   IF you received labwork today, you will receive an invoice from [facility]. Please contact [facility] at [phone] with questions or concerns regarding your invoice.   Our billing staff will not be able to assist you with questions regarding bills from these  companies.  You will be contacted with the lab results as soon as they are available. The fastest way to get your results is to activate your My Chart account. Instructions are located on the last page of this paperwork. If you have not heard from [place] regarding the results in 2 weeks, please contact this office.      Hemorrhoids Hemorrhoids are swollen veins in and around the rectum or anus. Hemorrhoids can cause pain, itching, or bleeding. Most of the time, they do not cause serious problems. They usually get better with diet changes, lifestyle changes, and other home treatments. Follow these instructions at home: Eating and drinking  Eat foods that have fiber, such as whole grains, beans, nuts, fruits, and vegetables. Ask your doctor about taking products that have added fiber (fibersupplements).  Drink enough fluid to keep your pee (urine) clear or pale yellow. For Pain and Swelling  Take a warm-water bath (sitz bath) for 20 minutes to ease pain. Do this 3-4 times a day.  If directed, put ice on the painful area. It may be helpful to use ice between your warm baths. ? Put ice in a plastic bag. ? Place a towel between your skin and the bag. ? Leave the ice on for 20 minutes, 2-3 times a day. General instructions  Take over-the-counter and prescription medicines only as told by your doctor. ? Medicated creams and medicines that are inserted into the anus (suppositories) may be used or applied as told.  Exercise often.  Go to the bathroom when you have the urge to poop (to have a bowel movement). Do not wait.  Avoid pushing too hard (straining) when you poop.  Keep the butt area dry and clean. Use wet toilet paper or moist paper towels.  Do not sit on the toilet for a long time. Contact a doctor if:  You have any of these: ? Pain and swelling that do not get better with treatment or medicine. ? Bleeding that will not stop. ? Trouble pooping or you cannot poop. ? Pain or  swelling outside the area of the hemorrhoids. This information is not intended to replace advice given to you by your health care provider. Make sure you discuss any questions you have with your health care provider. Document Released: 06/09/2008 Document Revised: 02/06/2016 Document Reviewed: 05/15/2015 Elsevier Interactive Patient Education  2018 Elsevier Inc.     Edwina Barth, MD [facility]

## 2019-02-13 DIAGNOSIS — K0889 Other specified disorders of teeth and supporting structures: Secondary | ICD-10-CM | POA: Insufficient documentation

## 2019-02-13 DIAGNOSIS — J320 Chronic maxillary sinusitis: Secondary | ICD-10-CM | POA: Insufficient documentation

## 2024-05-25 ENCOUNTER — Ambulatory Visit: Admitting: Family Medicine

## 2024-05-31 ENCOUNTER — Ambulatory Visit (INDEPENDENT_AMBULATORY_CARE_PROVIDER_SITE_OTHER): Admitting: Family Medicine

## 2024-05-31 ENCOUNTER — Encounter: Payer: Self-pay | Admitting: Family Medicine

## 2024-05-31 VITALS — BP 119/78 | HR 65 | Temp 98.2°F | Ht 68.0 in | Wt 182.0 lb

## 2024-05-31 DIAGNOSIS — Z7689 Persons encountering health services in other specified circumstances: Secondary | ICD-10-CM | POA: Diagnosis not present

## 2024-05-31 DIAGNOSIS — B369 Superficial mycosis, unspecified: Secondary | ICD-10-CM | POA: Insufficient documentation

## 2024-05-31 DIAGNOSIS — Z0000 Encounter for general adult medical examination without abnormal findings: Secondary | ICD-10-CM | POA: Insufficient documentation

## 2024-05-31 DIAGNOSIS — R198 Other specified symptoms and signs involving the digestive system and abdomen: Secondary | ICD-10-CM | POA: Insufficient documentation

## 2024-05-31 DIAGNOSIS — R5383 Other fatigue: Secondary | ICD-10-CM | POA: Insufficient documentation

## 2024-05-31 MED ORDER — KETOCONAZOLE 2 % EX CREA: 1.0000 | TOPICAL_CREAM | Freq: Every day | CUTANEOUS | 1 refills | Status: AC

## 2024-05-31 NOTE — Progress Notes (Signed)
 New Patient Office Visit  Subjective    Patient ID: Todd Harding, male    DOB: [birth year]  Age: 38 y.o. MRN: [id number]  CC:  Chief Complaint  Patient presents with   Establish Care    Rash under both armpits, for several months, went away and came back.  Bought athletes foot spray and it mostly went away and then it came back now it is coming up on my chest.  Would also like to get blood work.    HPI Todd Harding presents to establish care with this practice. He is new to me.  Rash:  Rash under both arms and some areas on his chest. Tried antifungals OTC. Looks worse after hot shower.  Low energy: Has had low end of normal range. Denies erectile dysfunction. Has lost size   Concerned about celiac disease: Has not been able to go gluten free. Symptoms include constipation and diarrhea. Has discomfort in stomach.    Outpatient Encounter Medications as of 05/31/2024  Medication Sig   ketoconazole  (NIZORAL ) 2 % cream Apply 1 Application topically daily.   clindamycin  (CLEOCIN ) 300 MG capsule Take 1 capsule (300 mg total) by mouth 4 (four) times daily. X 7 days (Patient not taking: Reported on 05/31/2024)   hydrocortisone  (ANUSOL -HC) 2.5 % rectal cream Place 1 application rectally 2 (two) times daily. (Patient not taking: Reported on 05/31/2024)   ibuprofen (ADVIL,MOTRIN) 200 MG tablet Take 800 mg by mouth once. (Patient not taking: Reported on 05/31/2024)   naproxen  (NAPROSYN ) 250 MG tablet Take 1 tablet (250 mg total) by mouth 2 (two) times daily with a meal. (Patient not taking: Reported on 05/31/2024)   SUMAtriptan  (IMITREX ) 100 MG tablet Take 1 tablet (100 mg total) by mouth every 2 (two) hours as needed for migraine or headache. May repeat in 2 hours if headache persists or recurs. (Patient not taking: Reported on 05/31/2024)   No facility-administered encounter medications on file as of 05/31/2024.    Past Medical History:  Diagnosis Date   Headache      Past Surgical History:  Procedure Laterality Date   COSMETIC SURGERY     bit by a dog on the face    History reviewed. No pertinent family history.  Social History   Socioeconomic History   Marital status: Single    Spouse name: Not on file   Number of children: Not on file   Years of education: Not on file   Highest education level: Not on file  Occupational History   Not on file  Tobacco Use   Smoking status: Never   Smokeless tobacco: Never  Substance and Sexual Activity   Alcohol use: Not Currently   Drug use: No    Comment: I have a THC vape but I don't really use it   Sexual activity: Not Currently    Comment: Just had a baby  Other Topics Concern   Not on file  Social History Narrative   Not on file   Social Drivers of Health   Financial Resource Strain: Not on file  Food Insecurity: Not on file  Transportation Needs: Not on file  Physical Activity: Not on file  Stress: Not on file  Social Connections: Unknown (01/26/2022)   [facility]   Social Network    Social Network: Not on file  Intimate Partner Violence: Unknown (12/18/2021)   Received from [facility]   HITS    Physically Hurt: Not on file    Insult or  Talk Down To: Not on file    Threaten Physical Harm: Not on file    Scream or Curse: Not on file    ROS      Objective    BP 119/78   Pulse 65   Temp 98.2 F (36.8 C) (Oral)   Ht 5' 8 (1.727 m)   Wt 182 lb (82.6 kg)   SpO2 100%   BMI 27.67 kg/m   Physical Exam Vitals and nursing note reviewed.  Constitutional:      General: He is not in acute distress.    Appearance: Normal appearance. He is normal weight.  Cardiovascular:     Rate and Rhythm: Normal rate and regular rhythm.     Heart sounds: Normal heart sounds.  Pulmonary:     Effort: Pulmonary effort is normal.     Breath sounds: Normal breath sounds.  Skin:    General: Skin is warm and dry.         Comments: Faint erythema on mid sternal chest  area. Axilla rash resolved before visit.   Neurological:     General: No focal deficit present.     Mental Status: He is alert. Mental status is at baseline.  Psychiatric:        Mood and Affect: Mood normal.        Behavior: Behavior normal.        Thought Content: Thought content normal.        Judgment: Judgment normal.         Assessment & Plan:   Problem List Items Addressed This Visit     Establishing care with new doctor, encounter for - Primary   Alternating constipation and diarrhea   Alternates between diarrhea and constipation. Wonders if he has celiac. Has been unable to go gluten free. Reports diet is crap Celiac panel today. Understands treatment is to avoid gluten. Follow-up based on result. May benefit from GI referral.       Relevant Orders   Celiac Disease Panel   Comprehensive metabolic panel with GFR   Other fatigue   Not interested in medication. Has been on low normal range in the past. Testosterone today.  Will research natural remedies to raise levels.       Relevant Orders   Testosterone,Free and Total   Comprehensive metabolic panel with GFR   Fungal skin infection   Symptoms improved with OTC antifungal medication. Rash is minimal today. Ketoconazole  2 % cream daily. Instructed to pay attention to when symptoms are worse. Follow-up as needed.        Relevant Medications   ketoconazole  (NIZORAL ) 2 % cream  Agrees with plan of care discussed.  Questions answered.   Return in about 1 week (around 06/07/2024) for CPE with labs.   Todd JONELLE Brownie, [name]

## 2024-05-31 NOTE — Assessment & Plan Note (Signed)
 Symptoms improved with OTC antifungal medication. Rash is minimal today. Ketoconazole  2 % cream daily. Instructed to pay attention to when symptoms are worse. Follow-up as needed.

## 2024-05-31 NOTE — Assessment & Plan Note (Signed)
 Not interested in medication. Has been on low normal range in the past. Testosterone today.  Will research natural remedies to raise levels.

## 2024-05-31 NOTE — Assessment & Plan Note (Signed)
 Alternates between diarrhea and constipation. Wonders if he has celiac. Has been unable to go gluten free. Reports diet is crap Celiac panel today. Understands treatment is to avoid gluten. Follow-up based on result. May benefit from GI referral.

## 2024-06-05 ENCOUNTER — Ambulatory Visit: Payer: Self-pay | Admitting: Family Medicine

## 2024-06-05 ENCOUNTER — Other Ambulatory Visit: Payer: Self-pay | Admitting: Family Medicine

## 2024-06-05 DIAGNOSIS — R7989 Other specified abnormal findings of blood chemistry: Secondary | ICD-10-CM | POA: Insufficient documentation

## 2024-06-05 DIAGNOSIS — D802 Selective deficiency of immunoglobulin A [IgA]: Secondary | ICD-10-CM | POA: Insufficient documentation

## 2024-06-05 LAB — TESTOSTERONE,FREE AND TOTAL
Testosterone, Free: 4.9 pg/mL — ABNORMAL LOW (ref 8.7–25.1)
Testosterone: 351 ng/dL (ref 264–916)

## 2024-06-05 LAB — TISSUE TRANSGLUTAMINASE, IGG: Tissue Transglut Ab: <2 U/mL (ref 0–5)

## 2024-06-05 LAB — CELIAC DISEASE PANEL
Endomysial IgA: NEGATIVE
IgA/Immunoglobulin A, Serum: 75 mg/dL — ABNORMAL LOW (ref 90–386)
Transglutaminase IgA: <2 U/mL (ref 0–3)

## 2024-06-05 LAB — COMPREHENSIVE METABOLIC PANEL WITH GFR
ALT: 21 IU/L (ref 0–44)
AST: 26 IU/L (ref 0–40)
Albumin: 5.2 g/dL — ABNORMAL HIGH (ref 4.1–5.1)
Alkaline Phosphatase: 61 IU/L (ref 47–123)
BUN/Creatinine Ratio: 12 (ref 9–20)
BUN: 13 mg/dL (ref 6–20)
Bilirubin Total: 0.8 mg/dL (ref 0.0–1.2)
CO2: 24 mmol/L (ref 20–29)
Calcium: 10 mg/dL (ref 8.7–10.2)
Chloride: 102 mmol/L (ref 96–106)
Creatinine, Ser: 1.07 mg/dL (ref 0.76–1.27)
Globulin, Total: 1.8 g/dL (ref 1.5–4.5)
Glucose: 86 mg/dL (ref 70–99)
Potassium: 4.5 mmol/L (ref 3.5–5.2)
Sodium: 141 mmol/L (ref 134–144)
Total Protein: 7 g/dL (ref 6.0–8.5)
eGFR: 93 mL/min/1.73 (ref >59)

## 2024-06-20 NOTE — Progress Notes (Unsigned)
    Chief Complaint: No chief complaint on file.   History of Present Illness:  Todd Harding is a 38 y.o. male who is seen in consultation from [name], [name] for evaluation of ***.   Past Medical History:  Past Medical History:  Diagnosis Date   Headache     Past Surgical History:  Past Surgical History:  Procedure Laterality Date   COSMETIC SURGERY     bit by a dog on the face   Tubal Litigation  2016    Allergies:  Allergies  Allergen Reactions   Naproxen  Rash   Penicillins     unknown    Family History:  Family History  Problem Relation Age of Onset   Healthy Mother    Healthy Father     Social History:  Social History   Tobacco Use   Smoking status: Never   Smokeless tobacco: Never  Substance Use Topics   Alcohol use: Not Currently   Drug use: No    Comment: I have a THC vape but I don't really use it    Review of symptoms:  Constitutional:  Negative for unexplained weight loss, night sweats, fever, chills ENT:  Negative for nose bleeds, sinus pain, painful swallowing CV:  Negative for chest pain, shortness of breath, exercise intolerance, palpitations, loss of consciousness Resp:  Negative for cough, wheezing, shortness of breath GI:  Negative for nausea, vomiting, diarrhea, bloody stools GU:  Positives noted in HPI; otherwise negative for gross hematuria, dysuria, urinary incontinence Neuro:  Negative for seizures, poor balance, limb weakness, slurred speech Psych:  Negative for lack of energy, depression, anxiety Endocrine:  Negative for polydipsia, polyuria, symptoms of hypoglycemia (dizziness, hunger, sweating) Hematologic:  Negative for anemia, purpura, petechia, prolonged or excessive bleeding, use of anticoagulants  Allergic:  Negative for difficulty breathing or choking as a result of exposure to anything; no shellfish allergy; no allergic response (rash/itch) to materials, foods  Physical exam: There were no vitals taken for  this visit. GENERAL APPEARANCE:  Well appearing, well developed, well nourished, NAD HEENT: Atraumatic, Normocephalic. NECK: Normal appearance LUNGS: Normal inspiratory and expiratory excursion HEART: Regular Rate ABDOMEN: ***. GU: Phallus normal, no lesions. Scrotal skin normal. Testicles/epididymal structures normal. Meatus normal. Normal anal sphincter tone, prostate ***mL, symmetric, non nodular, non tender. EXTREMITIES: Moves all extremities well.  Without clubbing, cyanosis, or edema. NEUROLOGIC:  Alert and oriented x 3, normal gait, CN II-XII grossly intact.  MENTAL STATUS:  Appropriate. SKIN:  Warm, dry and intact.    Results: No results found for this or any previous visit (from the past 24 hours).  I have reviewed referring/prior physicians notes  I have reviewed urinalysis  I have reviewed PSA results  I have reviewed prior imaging  I have reviewed urine culture results  Assessment: ***   Plan: ***

## 2024-06-21 ENCOUNTER — Ambulatory Visit: Admitting: Urology

## 2024-06-21 VITALS — BP 129/90 | HR 67 | Ht 68.0 in | Wt 179.0 lb

## 2024-06-21 DIAGNOSIS — R7989 Other specified abnormal findings of blood chemistry: Secondary | ICD-10-CM

## 2024-06-21 DIAGNOSIS — R5383 Other fatigue: Secondary | ICD-10-CM | POA: Diagnosis not present

## 2024-06-21 DIAGNOSIS — R6882 Decreased libido: Secondary | ICD-10-CM

## 2024-06-22 ENCOUNTER — Other Ambulatory Visit

## 2024-06-22 DIAGNOSIS — R7989 Other specified abnormal findings of blood chemistry: Secondary | ICD-10-CM

## 2024-06-23 LAB — TESTOSTERONE,FREE AND TOTAL
Testosterone, Free: 11.3 pg/mL (ref 8.7–25.1)
Testosterone: 379 ng/dL (ref 264–916)

## 2024-06-25 ENCOUNTER — Ambulatory Visit: Payer: Self-pay | Admitting: Urology

## 2024-07-04 ENCOUNTER — Encounter: Admitting: Family Medicine

## 2024-07-07 ENCOUNTER — Ambulatory Visit: Admitting: Urology

## 2024-07-07 ENCOUNTER — Ambulatory Visit (INDEPENDENT_AMBULATORY_CARE_PROVIDER_SITE_OTHER): Payer: Self-pay | Admitting: Internal Medicine

## 2024-07-07 ENCOUNTER — Encounter: Payer: Self-pay | Admitting: Internal Medicine

## 2024-07-07 ENCOUNTER — Other Ambulatory Visit: Payer: Self-pay

## 2024-07-07 VITALS — BP 120/78 | HR 89 | Temp 98.3°F | Resp 20 | Ht 68.0 in | Wt 182.3 lb

## 2024-07-07 DIAGNOSIS — R198 Other specified symptoms and signs involving the digestive system and abdomen: Secondary | ICD-10-CM

## 2024-07-07 DIAGNOSIS — J310 Chronic rhinitis: Secondary | ICD-10-CM | POA: Diagnosis not present

## 2024-07-07 DIAGNOSIS — R7689 Other specified abnormal immunological findings in serum: Secondary | ICD-10-CM | POA: Diagnosis not present

## 2024-07-07 DIAGNOSIS — K9041 Non-celiac gluten sensitivity: Secondary | ICD-10-CM

## 2024-07-07 NOTE — Patient Instructions (Signed)
 Possible celiac disease Gastrointestinal symptoms including alternating constipation and diarrhea, with a genetic predisposition for celiac disease. Symptoms have persisted for a couple of years, especially following new baby in home. A gluten-free diet has provided some symptom relief but is difficult to maintain. Low IgA level complicates celiac panel results, potentially causing false negatives. Differential diagnosis includes celiac disease, gluten intolerance, and gluten allergy, though symptoms do not suggest a true gluten allergy due to absence of anaphylaxis. Other GI causes should also be considered.  - Refer to gastroenterologist for further evaluation of celiac disease diagnosis and or other potential causes ofr symptoms.  Low IgA level Low IgA level present but not consistent with IgA deficiency as some IgA is produced. No history of frequent infections or hospitalizations, indicating no significant immune deficiency. - No further immunological workup required.  Chronic rhinitis - Consider environmental allergy testing if symptoms warrant.  Follow up : as needed It was a pleasure meeting you in clinic today! Thank you for allowing me to participate in your care.  Rocky Endow, MD [facility]

## 2024-07-07 NOTE — Progress Notes (Signed)
 NEW PATIENT Date of Service/Encounter:   07/07/2024 Referring provider: [name], [name] Primary care provider: [name], [name]  Subjective:  Todd Harding is a 38 y.o. male presenting today for evaluation of low Ig A levels and concenr for celiac disease.  History obtained from: chart review and patient.   Discussed the use of AI scribe software for clinical note transcription with the patient, who gave verbal consent to proceed.  History of Present Illness Todd Harding is a 38 year old male who presents with concerns about possible celiac disease and low IgA levels. He was referred by his primary care doctor for evaluation of low IgA levels and possible celiac disease.  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 identified on recent blood work performed by primary care physician - No history of frequent infections, asthma, or autoimmune diseases - No history of prolonged hospitalizations or need for IV antibiotics  Gastrointestinal symptoms - Alternating constipation and diarrhea with irregular bowel movement pattern - Increase in gastrointestinal symptoms over the past couple of years, particularly after having a baby - Partial improvement in symptoms with a gluten-free diet, though difficult to maintain - No formal evaluation by a gastroenterologist to date  Celiac disease risk factors - Concern for celiac disease due to gastrointestinal symptoms and low IgA levels - [employer] genetic testing revealed presence of a gene associated with celiac disease  Allergic rhinitis - Mild seasonal allergies, primarily in the spring - Symptoms include occasional nasal stuffiness and sneezing - Intermittent use of allergy medication, not required consistently  Infectious disease history - Hospitalized at age 39 for suspected swine flu - No recurrent or severe infections since that time  Chart Review:  Reviewed PCP notes from referral 07/07/24: He was being  worked up for celiac disease with symptoms of constipation and diarrhea.  Labs obtained were endomysial IgA which was negative, transglutaminase IgA which was negative.  However his total IgA level was 75, outside of the lower limit of normal, for which he was referred to immunology.  Past Medical History: Past Medical History:  Diagnosis Date   Headache    Medication List:  Current Outpatient Medications  Medication Sig Dispense Refill   ketoconazole  (NIZORAL ) 2 % cream Apply 1 Application topically daily. 60 g 1   No current facility-administered medications for this visit.   Known Allergies:  Allergies  Allergen Reactions   Naproxen  Rash   Penicillins     unknown   Past Surgical History: Past Surgical History:  Procedure Laterality Date   COSMETIC SURGERY     bit by a dog on the face   Tubal Litigation  2016   Family History: Family History  Problem Relation Age of Onset   Healthy Mother    Healthy Father    COPD Neg Hx    Asthma Neg Hx    Lupus Neg Hx    Emphysema Neg Hx    Chronic bronchitis Neg Hx    Angioedema Neg Hx    Allergic rhinitis Neg Hx    Social History: Todd Harding lives in an apartment built 20 years ago, no water damage, carpet in bedroom, electrocuting, central AC, 2 dogs, no roaches, using dust mite on the bed but not pillows, works in [employer].  + HEPA filter in the home.  [place]   ROS:  All other systems negative except as noted per HPI.  Objective:  Blood pressure 120/78, pulse 89, temperature 98.3 F (36.8 C),  temperature source Oral, resp. rate 20, height 5' 8 (1.727 m), weight 182 lb 4.8 oz (82.7 kg), SpO2 99%. Body mass index is 27.72 kg/m. Physical Exam:  General Appearance:  Alert, cooperative, no distress, appears stated age  Head:  Normocephalic, without obvious abnormality, atraumatic  Eyes:  Conjunctiva clear, EOM's intact  Nose: Nares normal, no rhinorrhea  Throat: Lips, tongue normal; teeth and  gums normal, moist mucus membranes  Neck: Supple, symmetrical  Lungs:   clear to auscultation bilaterally, Respirations unlabored, no coughing  Heart:  regular rate and rhythm and no murmur, Appears well perfused  Extremities: No edema  Skin: Skin color, texture, turgor normal and no rashes or lesions on visualized portions of skin  Neurologic: No gross deficits   Diagnostics:  Labs:  Lab Orders  No laboratory test(s) ordered today     Assessment and Plan  Assessment and Plan Assessment & Plan Possible celiac disease Gastrointestinal symptoms including alternating constipation and diarrhea, with a genetic predisposition for celiac disease. Symptoms have persisted for a couple of years, especially following new baby in home. A gluten-free diet has provided some symptom relief but is difficult to maintain. Low IgA level complicates celiac panel results, potentially causing false negatives. Differential diagnosis includes celiac disease, gluten intolerance, and gluten allergy, though symptoms do not suggest a true gluten allergy due to absence of anaphylaxis. Other GI causes should also be considered.  - Refer to gastroenterologist for further evaluation of celiac disease diagnosis and or other potential causes ofr symptoms.  Low IgA level Low IgA level present but not consistent with IgA deficiency as some IgA is produced. No history of frequent infections or hospitalizations, indicating no significant immune deficiency. - No further immunological workup required. -Removing IgA deficiency from problem list  Chronic rhinitis - Consider environmental allergy testing if symptoms warrant.  Follow up : as needed It was a pleasure meeting you in clinic today! Thank you for allowing me to participate in your care.  Rocky Endow, MD [facility]       This note in its entirety was forwarded to the Provider who requested this consultation.  Other: none  Thank you  for your kind referral. I appreciate the opportunity to take part in Todd Harding's care. Please do not hesitate to contact me with questions.  Sincerely,  Rocky Endow, MD [facility] 

## 2024-07-17 ENCOUNTER — Encounter: Payer: Self-pay | Admitting: Internal Medicine

## 2024-07-26 NOTE — Telephone Encounter (Signed)
 Maxemiliano has been scheduled with [facility] for 12/19 atr 9:40 am wirth Dr[address] Craig.  Apparently [facility] is understaffed and are not accepting phone calls or making phone calls.  So Zamarian was lucky to be scheduled.  I am having to refer outside of .

## 2024-08-31 NOTE — Progress Notes (Deleted)
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 male with a past medical history listed below presents for evaluation of alternating diarrhea constipation.   *** Discussed the use of AI scribe software for clinical note transcription with the patient, who gave verbal consent to proceed.  History of Present Illness            He  reports that he has never smoked. He has never used smokeless tobacco. He reports that he does not currently use alcohol. He reports that he does not use drugs.  RELEVANT GI HISTORY, IMAGING AND LABS: Results          CBC No results found for: WBC, RBC, HGB, HCT, PLT, MCV, MCH, MCHC, RDW, LYMPHSABS, MONOABS, EOSABS, BASOSABS No results for input(s): HGB in the last 8760 hours.  CMP     Component Value Date/Time   NA 141 05/31/2024 1420   K 4.5 05/31/2024 1420   CL 102 05/31/2024 1420   CO2 24 05/31/2024 1420   GLUCOSE 86 05/31/2024 1420   BUN 13 05/31/2024 1420   CREATININE 1.07 05/31/2024 1420   CALCIUM 10.0 05/31/2024 1420   PROT 7.0 05/31/2024 1420   ALBUMIN 5.2 (H) 05/31/2024 1420   AST 26 05/31/2024 1420   ALT 21 05/31/2024 1420   ALKPHOS 61 05/31/2024 1420   BILITOT 0.8 05/31/2024 1420      Latest Ref Rng & Units 05/31/2024    2:20 PM  Hepatic Function  Total Protein 6.0 - 8.5 g/dL 7.0   Albumin 4.1 - 5.1 g/dL 5.2   AST 0 - 40 IU/L 26   ALT 0 - 44 IU/L 21   Alk Phosphatase 47 - 123 IU/L 61   Total Bilirubin 0.0 - 1.2 mg/dL 0.8       Current Medications:   Current Outpatient Medications (Other):    ketoconazole  (NIZORAL ) 2 % cream, Apply 1 Application topically daily.  Medical History:  Past  Medical History:  Diagnosis Date   Headache    Allergies: Allergies[1]   Surgical History:  He  has a past surgical history that includes Cosmetic surgery and Tubal Litigation (2016). Family History:  His family history includes Healthy in his father and mother.  REVIEW OF SYSTEMS  : All other systems reviewed and negative except where noted in the History of Present Illness.  PHYSICAL EXAM: There were no vitals taken for this visit. Physical Exam          Alan SAUNDERS Coombs, PA-C 12:43 PM      [1]  Allergies Allergen Reactions   Naproxen  Rash   Penicillins     unknown

## 2024-09-01 ENCOUNTER — Ambulatory Visit: Admitting: Physician Assistant

## 2024-09-11 ENCOUNTER — Other Ambulatory Visit: Payer: Self-pay

## 2024-09-11 ENCOUNTER — Emergency Department (HOSPITAL_BASED_OUTPATIENT_CLINIC_OR_DEPARTMENT_OTHER)
Admission: EM | Admit: 2024-09-11 | Discharge: 2024-09-11 | Disposition: A | Discharge status: Home / Self Care | Attending: Emergency Medicine | Admitting: Emergency Medicine

## 2024-09-11 ENCOUNTER — Encounter (HOSPITAL_BASED_OUTPATIENT_CLINIC_OR_DEPARTMENT_OTHER): Payer: Self-pay | Admitting: Emergency Medicine

## 2024-09-11 ENCOUNTER — Emergency Department (HOSPITAL_BASED_OUTPATIENT_CLINIC_OR_DEPARTMENT_OTHER)

## 2024-09-11 DIAGNOSIS — J40 Bronchitis, not specified as acute or chronic: Secondary | ICD-10-CM | POA: Diagnosis not present

## 2024-09-11 DIAGNOSIS — R059 Cough, unspecified: Secondary | ICD-10-CM | POA: Diagnosis present

## 2024-09-11 LAB — RESP PANEL BY RT-PCR (RSV, FLU A&B, COVID)  RVPGX2
Influenza A by PCR: NEGATIVE
Influenza B by PCR: NEGATIVE
Resp Syncytial Virus by PCR: NEGATIVE
SARS Coronavirus 2 by RT PCR: NEGATIVE

## 2024-09-11 MED ORDER — BENZONATATE 100 MG PO CAPS: 100.0000 mg | ORAL_CAPSULE | Freq: Three times a day (TID) | ORAL | 0 refills | Status: AC | PRN

## 2024-09-11 MED ORDER — DOXYCYCLINE HYCLATE 100 MG PO CAPS: 100.0000 mg | ORAL_CAPSULE | Freq: Two times a day (BID) | ORAL | 0 refills | Status: AC

## 2024-09-11 MED ORDER — ALBUTEROL SULFATE HFA 108 (90 BASE) MCG/ACT IN AERS
2.0000 | INHALATION_SPRAY | Freq: Once | RESPIRATORY_TRACT | Status: AC
  Administered 2024-09-11: 2 via RESPIRATORY_TRACT
  Filled 2024-09-11: qty 6.7

## 2024-09-11 MED ORDER — DEXAMETHASONE SOD PHOSPHATE PF 10 MG/ML IJ SOLN
10.0000 mg | Freq: Once | INTRAMUSCULAR | Status: AC
  Administered 2024-09-11: 10 mg via INTRAMUSCULAR

## 2024-09-11 MED ORDER — DOXYCYCLINE HYCLATE 100 MG PO TABS
100.0000 mg | ORAL_TABLET | Freq: Once | ORAL | Status: AC
  Administered 2024-09-11: 100 mg via ORAL
  Filled 2024-09-11: qty 1

## 2024-09-11 NOTE — Discharge Instructions (Signed)
 I am treating you for bronchitis.  Please follow closely with the primary care doctor.  You will need to call for an appointment.  If you develop worsening shortness of breath or start having chest pain you should return to the emergency department immediately for reevaluation.

## 2024-09-11 NOTE — ED Notes (Signed)
 ..  The patient is A&OX4, ambulatory at d/c with independent steady gait, NAD. Pt verbalized understanding of d/c instructions, prescription and follow up care.

## 2024-09-11 NOTE — ED Triage Notes (Signed)
 URI Sx for weeks, cough, nasal congestion, SOB worse with exertion.

## 2024-09-11 NOTE — ED Provider Notes (Signed)
 "  Emergency Department Provider Note   I have reviewed the triage vital signs and the nursing notes.   HISTORY  Chief Complaint URI and Shortness of Breath   HPI Todd Harding is a 38 y.o. male with past history reviewed below presents to the emergency department with several weeks of cough with nasal congestion.  He has some associated shortness of breath which he finds worse with exertion.  He denies any chest discomfort.  He works in a warehouse with heavy moving and lifting and has increasingly had shortness of breath with this activity.  No fevers or chills.  No known sick contacts.  Denies vomiting or abdominal pain.   Past Medical History:  Diagnosis Date   Headache     Review of Systems  Constitutional: No fever/chills Cardiovascular: Denies chest pain. Respiratory: Positive shortness of breath. Positive cough.  Gastrointestinal: No abdominal pain.  No nausea, no vomiting.  Skin: Negative for rash. Neurological: Negative for headaches. ____________________________________________   PHYSICAL EXAM:  VITAL SIGNS: ED Triage Vitals  Encounter Vitals Group     BP 09/11/24 0017 (!) 135/93     Pulse Rate 09/11/24 0017 (!) 106     Resp 09/11/24 0017 20     Temp 09/11/24 0017 98.8 F (37.1 C)     Temp src --      SpO2 09/11/24 0017 100 %     Weight 09/11/24 0018 180 lb (81.6 kg)     Height 09/11/24 0018 5' 9 (1.753 m)   Constitutional: Alert and oriented. Well appearing and in no acute distress. Eyes: Conjunctivae are normal.  Head: Atraumatic. Nose: No congestion/rhinnorhea. Mouth/Throat: Mucous membranes are moist. Neck: No stridor.   Cardiovascular: Normal rate, regular rhythm. Good peripheral circulation. Grossly normal heart sounds.   Respiratory: Normal respiratory effort.  No retractions. Lungs CTAB. Gastrointestinal: Soft and nontender. No distention.  Musculoskeletal: No lower extremity tenderness nor edema. No gross deformities of  extremities. Neurologic:  Normal speech and language.  Skin:  Skin is warm, dry and intact. No rash noted.   ____________________________________________   LABS (all labs ordered are listed, but only abnormal results are displayed)  Labs Reviewed  RESP PANEL BY RT-PCR (RSV, FLU A&B, COVID)  [id number]   ____________________________________________  RADIOLOGY  DG Chest 2 View Result Date: 09/11/2024 EXAM: 2 VIEW(S) XRAY OF THE CHEST 09/11/2024 12:54:00 AM COMPARISON: None available. CLINICAL HISTORY: SOB - CP FINDINGS: LUNGS AND PLEURA: Interstitial thickening in keeping with airway inflammation within the right perihilar region and right lower lobe. No superimposed confluent pulmonary infiltrate. No pleural effusion. No pneumothorax. HEART AND MEDIASTINUM: No acute abnormality of the cardiac and mediastinal silhouettes. BONES AND SOFT TISSUES: No acute osseous abnormality. IMPRESSION: 1. Interstitial thickening in the right perihilar region and right lower lobe, consistent with airway inflammation, without superimposed confluent pulmonary infiltrate. Electronically signed by: Dorethia Molt MD 09/11/2024 01:13 AM EST RP Workstation: [id number]    ____________________________________________   PROCEDURES  Procedure(s) performed:   Procedures  None  ____________________________________________   INITIAL IMPRESSION / ASSESSMENT AND PLAN / ED COURSE  Pertinent labs & imaging results that were available during my care of the patient were reviewed by me and considered in my medical decision making (see chart for details).   This patient is Presenting for Evaluation of SOB, which does require a range of treatment options, and is a complaint that involves a high risk of morbidity and mortality.  The Differential Diagnoses include bronchitis, CAP, COVID, Flu, RSV, etc.  Critical Interventions-    Medications  albuterol  (VENTOLIN  HFA) 108 (90 Base) MCG/ACT inhaler 2 puff (2 puffs  Inhalation Given 09/11/24 0103)  dexamethasone  (DECADRON ) injection 10 mg (10 mg Intramuscular Given 09/11/24 0132)  doxycycline  (VIBRA -TABS) tablet 100 mg (100 mg Oral Given 09/11/24 0132)    Reassessment after intervention: symptoms improved.   Clinical Laboratory Tests Ordered, included COVID, flu, RSV PCR negative.  Radiologic Tests Ordered, included CXR. I independently interpreted the images and agree with radiology interpretation.   Cardiac Monitor Tracing which shows NSR.    Social Determinants of Health Risk patient is a non-smoker.   Medical Decision Making: Summary:  Patient presents emergency department with cough, congestion symptoms with shortness of breath.  No significant wheezing on my assessment but patient reports some subjective wheezing when symptoms are increased.  Very low risk for PE or ACS. No CP. CXR with interstitial thickening consistent with airway inflammation but no focal infiltrate.  Plan to treat with MDI, Decadron , doxycycline  for bronchitis.  Advise close follow-up with PCP as he may need referral to pulmonology if symptoms persist.  Patient's presentation is most consistent with acute, uncomplicated illness.   Disposition: discharge  ____________________________________________  FINAL CLINICAL IMPRESSION(S) / ED DIAGNOSES  Final diagnoses:  Bronchitis     NEW OUTPATIENT MEDICATIONS STARTED DURING THIS VISIT:  Discharge Medication List as of 09/11/2024  1:28 AM     START taking these medications   Details  benzonatate (TESSALON) 100 MG capsule Take 1 capsule (100 mg total) by mouth 3 (three) times daily as needed for cough., Starting Mon 09/11/2024, Normal    doxycycline  (VIBRAMYCIN ) 100 MG capsule Take 1 capsule (100 mg total) by mouth 2 (two) times daily for 7 days., Starting Mon 09/11/2024, Until Mon 09/18/2024, Normal        Note:  This document was prepared using Dragon voice recognition software and may include unintentional  dictation errors.  Fonda Law, MD, [facility]    Athan Casalino, Fonda MATSU, MD 09/11/24 [phone]  "

## 2024-10-10 ENCOUNTER — Encounter (HOSPITAL_BASED_OUTPATIENT_CLINIC_OR_DEPARTMENT_OTHER): Payer: Self-pay | Admitting: Emergency Medicine

## 2024-10-10 ENCOUNTER — Emergency Department (HOSPITAL_BASED_OUTPATIENT_CLINIC_OR_DEPARTMENT_OTHER)
Admission: EM | Admit: 2024-10-10 | Discharge: 2024-10-10 | Disposition: A | Discharge status: Home / Self Care | Attending: Emergency Medicine | Admitting: Emergency Medicine

## 2024-10-10 ENCOUNTER — Other Ambulatory Visit: Payer: Self-pay

## 2024-10-10 DIAGNOSIS — R052 Subacute cough: Secondary | ICD-10-CM | POA: Insufficient documentation

## 2024-10-10 MED ORDER — DM-GUAIFENESIN ER 30-600 MG PO TB12: 1.0000 | ORAL_TABLET | Freq: Two times a day (BID) | ORAL | Status: DC

## 2024-10-10 MED ORDER — ALBUTEROL SULFATE HFA 108 (90 BASE) MCG/ACT IN AERS: 1.0000 | INHALATION_SPRAY | Freq: Four times a day (QID) | RESPIRATORY_TRACT | 0 refills | Status: AC | PRN

## 2024-10-10 MED ORDER — METHYLPREDNISOLONE 4 MG PO TBPK: ORAL_TABLET | ORAL | 0 refills | Status: AC

## 2024-10-10 MED ORDER — METHYLPREDNISOLONE 4 MG PO TBPK: ORAL_TABLET | ORAL | 0 refills | Status: DC

## 2024-10-10 MED ORDER — GUAIFENESIN-DM 100-10 MG/5ML PO SYRP
15.0000 mL | ORAL_SOLUTION | Freq: Once | ORAL | Status: AC
  Administered 2024-10-10: 15 mL via ORAL
  Filled 2024-10-10: qty 15

## 2024-10-10 NOTE — ED Provider Notes (Signed)
 " [place] [facility] Provider Note  CSN: [id number] Arrival date & time: 10/10/24 0125  Chief Complaint(s) Cough  History provided by patient. HPI & MDM Todd Harding is a 39 y.o. male .   Cough  Ongoing for 1 week.  Nonproductive.  No associated fevers.  Patient did report having URI symptoms last week and positive sick contacts at home.  Cough does improve with albuterol  puffs and cool air.  It is exacerbated with hot air or extremely cold air.  Patient reports having similar cough last month and treated for bronchitis which is where he received the albuterol  inhaler.  Patient reports that the cough had nearly gone away and was no longer using the inhaler up until last week.   Medical Decision Making Risk OTC drugs. Prescription drug management.    Medical Decision Making:  The patient's presentation involves an extensive number of treatment options, and the complaint(s) carry with it a high risk of complications and morbidity. The differential diagnosis includes but not limited to those listed below:  Cough Lungs clear to auscultation bilaterally.  Suspicion for pneumonia is low.  Doubt pneumothorax. Suspect bronchitis from new viral infection.  Final Clinical Impression(s) / ED Diagnoses Final diagnoses:  Subacute cough   The patient appears reasonably screened and/or stabilized for discharge and I doubt any other medical condition or other [name] requiring further screening, evaluation, or treatment in the ED at this time. I have discussed the findings, Dx and Tx plan with the patient/family who expressed understanding and agree(s) with the plan. Discharge instructions discussed at length. The patient/family was given strict return precautions who verbalized understanding of the instructions. No further questions at time of discharge.  Disposition: Discharge  Condition: Good  ED Discharge Orders          Ordered     methylPREDNISolone  (MEDROL  DOSEPAK) 4 MG TBPK tablet        10/10/24 0217    albuterol  (VENTOLIN  HFA) 108 (90 Base) MCG/ACT inhaler  Every 6 hours PRN        10/10/24 0231              Follow Up: Todd Darice SAUNDERS, [name] [address] [place] [address] [phone]  Call  to schedule an appointment for close follow up     Past Medical History Past Medical History:  Diagnosis Date   Headache    Patient Active Problem List   Diagnosis Date Noted   Low testosterone  in male 06/05/2024   Annual physical exam 05/31/2024   Alternating constipation and diarrhea 05/31/2024   Other fatigue 05/31/2024   Fungal skin infection 05/31/2024   Left maxillary sinusitis 02/13/2019   Pain, dental 02/13/2019   Hemorrhoids, external, with complication 06/09/2017   Home Medication(s) Prior to Admission medications  Medication Sig Start Date End Date Taking? Authorizing Provider  albuterol  (VENTOLIN  HFA) 108 (90 Base) MCG/ACT inhaler Inhale 1-2 puffs into the lungs every 6 (six) hours as needed for wheezing or shortness of breath. 10/10/24  Yes Euan Wandler, Raynell Moder, MD  benzonatate  (TESSALON ) 100 MG capsule Take 1 capsule (100 mg total) by mouth 3 (three) times daily as needed for cough. 09/11/24   Long, Fonda MATSU, MD  ketoconazole  (NIZORAL ) 2 % cream Apply 1 Application topically daily. 05/31/24   Todd Darice SAUNDERS, [name]  methylPREDNISolone  (MEDROL  DOSEPAK) 4 MG TBPK tablet Use as directed on the package 10/10/24   Dorothie Wah, Raynell Moder, MD  Allergies Naproxen  and Penicillins  Review of Systems Review of Systems  Respiratory:  Positive for cough.    As noted in HPI  Physical Exam Vital Signs  I have reviewed the triage vital signs BP (!) 144/88 (BP Location: Right Arm)   Pulse 100   Temp 98.4 F (36.9 C) (Oral)   Resp (!) 22   Ht 5' 9 (1.753 m)    Wt 81.6 kg   SpO2 100%   BMI 26.57 kg/m   Physical Exam Vitals reviewed.  Constitutional:      General: He is not in acute distress.    Appearance: He is well-developed. He is not diaphoretic.  HENT:     Head: Normocephalic and atraumatic.     Nose: Nose normal.     Mouth/Throat:     Pharynx: No pharyngeal swelling or posterior oropharyngeal erythema.     Tonsils: No tonsillar exudate.  Eyes:     General: No scleral icterus.       Right eye: No discharge.        Left eye: No discharge.     Conjunctiva/sclera: Conjunctivae normal.     Pupils: Pupils are equal, round, and reactive to light.  Cardiovascular:     Rate and Rhythm: Normal rate and regular rhythm.     Heart sounds: No murmur heard.    No friction rub. No gallop.  Pulmonary:     Effort: Pulmonary effort is normal. No respiratory distress.     Breath sounds: Normal breath sounds. No stridor or decreased air movement. No decreased breath sounds, wheezing, rhonchi or rales.  Abdominal:     General: There is no distension.     Palpations: Abdomen is soft.     Tenderness: There is no abdominal tenderness.  Musculoskeletal:        General: No tenderness.     Cervical back: Normal range of motion and neck supple.  Skin:    General: Skin is warm and dry.     Findings: No erythema or rash.  Neurological:     Mental Status: He is alert and oriented to person, place, and time.     ED Results and Treatments Labs (all labs ordered are listed, but only abnormal results are displayed) Labs Reviewed - No data to display                                                                                                                       EKG  EKG Interpretation Date/Time:    Ventricular Rate:    PR Interval:    QRS Duration:    QT Interval:    QTC Calculation:   R Axis:      Text Interpretation:         Radiology No results found.  Medications Ordered in ED Medications  guaiFENesin -dextromethorphan   (ROBITUSSIN DM) 100-10 MG/5ML syrup 15 mL (15 mLs Oral Given 10/10/24 0237)   Procedures Procedures  (including critical care time)   This chart  was dictated using voice recognition software.  Despite best efforts to proofread,  errors can occur which can change the documentation meaning.   Trine Raynell Moder, MD 10/10/24 [phone]  "

## 2024-10-10 NOTE — ED Notes (Signed)
 ED Provider at bedside.

## 2024-10-10 NOTE — ED Triage Notes (Signed)
 Cough X 2 months, seen for same and had RX. Gotten better but has come back.

## 2025-06-22 ENCOUNTER — Other Ambulatory Visit
